# Patient Record
Sex: Female | Born: 1970 | Race: Black or African American | Hispanic: No | Smoking: Current every day smoker
Health system: Southern US, Community
[De-identification: ages and names within clinical notes are randomized; demographics above are authoritative.]

## PROBLEM LIST (undated history)

## (undated) DIAGNOSIS — F32A Depression, unspecified: Secondary | ICD-10-CM

## (undated) DIAGNOSIS — F329 Major depressive disorder, single episode, unspecified: Secondary | ICD-10-CM

## (undated) DIAGNOSIS — I10 Essential (primary) hypertension: Secondary | ICD-10-CM

## (undated) DIAGNOSIS — C801 Malignant (primary) neoplasm, unspecified: Secondary | ICD-10-CM

## (undated) DIAGNOSIS — Z8572 Personal history of non-Hodgkin lymphomas: Principal | ICD-10-CM

## (undated) HISTORY — PX: PORTACATH PLACEMENT: SHX2246

## (undated) HISTORY — PX: BONE BIOPSY: SHX375

## (undated) HISTORY — PX: ABDOMINAL HYSTERECTOMY: SHX81

## (undated) HISTORY — PX: PORT-A-CATH REMOVAL: SHX5289

## (undated) HISTORY — DX: Personal history of non-Hodgkin lymphomas: Z85.72

---

## 1997-09-03 ENCOUNTER — Ambulatory Visit (HOSPITAL_COMMUNITY): Admission: RE | Admit: 1997-09-03 | Discharge: 1997-09-03 | Payer: Self-pay | Admitting: Obstetrics & Gynecology

## 1997-10-07 ENCOUNTER — Other Ambulatory Visit: Admission: RE | Admit: 1997-10-07 | Discharge: 1997-10-07 | Payer: Self-pay | Admitting: *Deleted

## 1997-10-25 ENCOUNTER — Other Ambulatory Visit: Admission: RE | Admit: 1997-10-25 | Discharge: 1997-10-25 | Payer: Self-pay | Admitting: *Deleted

## 1998-01-13 ENCOUNTER — Inpatient Hospital Stay (HOSPITAL_COMMUNITY): Admission: AD | Admit: 1998-01-13 | Discharge: 1998-01-16 | Payer: Self-pay | Admitting: Obstetrics & Gynecology

## 1998-01-17 ENCOUNTER — Encounter (HOSPITAL_COMMUNITY): Admission: RE | Admit: 1998-01-17 | Discharge: 1998-01-28 | Payer: Self-pay | Admitting: Obstetrics & Gynecology

## 1998-01-20 ENCOUNTER — Inpatient Hospital Stay (HOSPITAL_COMMUNITY): Admission: AD | Admit: 1998-01-20 | Discharge: 1998-01-20 | Payer: Self-pay | Admitting: Obstetrics & Gynecology

## 1999-07-20 ENCOUNTER — Other Ambulatory Visit: Admission: RE | Admit: 1999-07-20 | Discharge: 1999-07-20 | Payer: Self-pay | Admitting: Family Medicine

## 2000-12-08 ENCOUNTER — Encounter: Payer: Self-pay | Admitting: Family Medicine

## 2000-12-08 ENCOUNTER — Encounter: Admission: RE | Admit: 2000-12-08 | Discharge: 2000-12-08 | Payer: Self-pay | Admitting: Family Medicine

## 2002-12-20 ENCOUNTER — Other Ambulatory Visit: Admission: RE | Admit: 2002-12-20 | Discharge: 2002-12-20 | Payer: Self-pay | Admitting: Family Medicine

## 2003-06-29 ENCOUNTER — Emergency Department (HOSPITAL_COMMUNITY): Admission: EM | Admit: 2003-06-29 | Discharge: 2003-06-29 | Payer: Self-pay

## 2006-12-15 ENCOUNTER — Ambulatory Visit (HOSPITAL_COMMUNITY): Admission: RE | Admit: 2006-12-15 | Discharge: 2006-12-15 | Payer: Self-pay | Admitting: Obstetrics & Gynecology

## 2007-06-19 ENCOUNTER — Inpatient Hospital Stay (HOSPITAL_COMMUNITY): Admission: AD | Admit: 2007-06-19 | Discharge: 2007-06-29 | Payer: Self-pay | Admitting: *Deleted

## 2007-06-19 ENCOUNTER — Ambulatory Visit: Payer: Self-pay | Admitting: *Deleted

## 2007-06-19 ENCOUNTER — Emergency Department (HOSPITAL_COMMUNITY): Admission: EM | Admit: 2007-06-19 | Discharge: 2007-06-19 | Payer: Self-pay | Admitting: Emergency Medicine

## 2008-01-08 ENCOUNTER — Emergency Department (HOSPITAL_COMMUNITY): Admission: EM | Admit: 2008-01-08 | Discharge: 2008-01-09 | Payer: Self-pay | Admitting: Emergency Medicine

## 2008-06-06 ENCOUNTER — Inpatient Hospital Stay (HOSPITAL_COMMUNITY): Admission: EM | Admit: 2008-06-06 | Discharge: 2008-06-18 | Payer: Self-pay | Admitting: Emergency Medicine

## 2008-06-07 ENCOUNTER — Encounter (INDEPENDENT_AMBULATORY_CARE_PROVIDER_SITE_OTHER): Payer: Self-pay | Admitting: Internal Medicine

## 2008-06-07 ENCOUNTER — Ambulatory Visit: Payer: Self-pay | Admitting: Hematology & Oncology

## 2008-06-10 ENCOUNTER — Encounter (INDEPENDENT_AMBULATORY_CARE_PROVIDER_SITE_OTHER): Payer: Self-pay | Admitting: Internal Medicine

## 2008-06-11 ENCOUNTER — Encounter (INDEPENDENT_AMBULATORY_CARE_PROVIDER_SITE_OTHER): Payer: Self-pay | Admitting: Interventional Radiology

## 2008-06-12 ENCOUNTER — Encounter (INDEPENDENT_AMBULATORY_CARE_PROVIDER_SITE_OTHER): Payer: Self-pay | Admitting: General Surgery

## 2008-06-14 ENCOUNTER — Encounter: Payer: Self-pay | Admitting: Hematology & Oncology

## 2008-06-15 ENCOUNTER — Ambulatory Visit: Payer: Self-pay | Admitting: Oncology

## 2008-06-18 ENCOUNTER — Ambulatory Visit: Payer: Self-pay | Admitting: Hematology & Oncology

## 2008-06-25 LAB — CHCC SATELLITE - SMEAR

## 2008-06-25 LAB — COMPREHENSIVE METABOLIC PANEL
ALT: 9 U/L (ref 0–35)
AST: 9 U/L (ref 0–37)
Chloride: 107 mEq/L (ref 96–112)
Creatinine, Ser: 0.65 mg/dL (ref 0.40–1.20)
Total Bilirubin: 0.5 mg/dL (ref 0.3–1.2)

## 2008-06-25 LAB — CBC WITH DIFFERENTIAL (CANCER CENTER ONLY)
BASO%: 0.3 % (ref 0.0–2.0)
Eosinophils Absolute: 0 10*3/uL (ref 0.0–0.5)
LYMPH#: 1.3 10*3/uL (ref 0.9–3.3)
MONO#: 0.1 10*3/uL (ref 0.1–0.9)
NEUT#: 0.3 10*3/uL — CL (ref 1.5–6.5)
Platelets: 81 10*3/uL — ABNORMAL LOW (ref 145–400)
RBC: 3.56 10*6/uL — ABNORMAL LOW (ref 3.70–5.32)
RDW: 15.4 % — ABNORMAL HIGH (ref 10.5–14.6)
WBC: 1.7 10*3/uL — ABNORMAL LOW (ref 3.9–10.0)

## 2008-07-10 LAB — COMPREHENSIVE METABOLIC PANEL
ALT: <8 U/L (ref 0–35)
AST: 11 U/L (ref 0–37)
BUN: 10 mg/dL (ref 6–23)
CO2: 21 mEq/L (ref 19–32)
Calcium: 8.9 mg/dL (ref 8.4–10.5)
Creatinine, Ser: 0.73 mg/dL (ref 0.40–1.20)
Total Bilirubin: 0.5 mg/dL (ref 0.3–1.2)

## 2008-07-10 LAB — CHCC SATELLITE - SMEAR

## 2008-07-10 LAB — CBC WITH DIFFERENTIAL (CANCER CENTER ONLY)
BASO#: 0 10*3/uL (ref 0.0–0.2)
Eosinophils Absolute: 0.1 10*3/uL (ref 0.0–0.5)
HCT: 26.5 % — ABNORMAL LOW (ref 34.8–46.6)
HGB: 8.9 g/dL — ABNORMAL LOW (ref 11.6–15.9)
LYMPH#: 1.3 10*3/uL (ref 0.9–3.3)
MCHC: 33.5 g/dL (ref 32.0–36.0)
MONO#: 0.3 10*3/uL (ref 0.1–0.9)
NEUT#: 1.4 10*3/uL — ABNORMAL LOW (ref 1.5–6.5)
NEUT%: 46.2 % (ref 39.6–80.0)
RBC: 3.08 10*6/uL — ABNORMAL LOW (ref 3.70–5.32)

## 2008-07-10 LAB — TECHNOLOGIST REVIEW CHCC SATELLITE

## 2008-07-24 ENCOUNTER — Ambulatory Visit (HOSPITAL_COMMUNITY): Admission: RE | Admit: 2008-07-24 | Discharge: 2008-07-24 | Payer: Self-pay | Admitting: Hematology & Oncology

## 2008-07-31 LAB — CBC WITH DIFFERENTIAL (CANCER CENTER ONLY)
BASO%: 0.6 % (ref 0.0–2.0)
EOS%: 0.1 % (ref 0.0–7.0)
Eosinophils Absolute: 0 10*3/uL (ref 0.0–0.5)
LYMPH%: 23.7 % (ref 14.0–48.0)
MCH: 32.3 pg (ref 26.0–34.0)
MONO%: 4.1 % (ref 0.0–13.0)
NEUT#: 3.5 10*3/uL (ref 1.5–6.5)
Platelets: 208 10*3/uL (ref 145–400)
RBC: 3.06 10*6/uL — ABNORMAL LOW (ref 3.70–5.32)
RDW: 26.8 % — ABNORMAL HIGH (ref 10.5–14.6)

## 2008-07-31 LAB — COMPREHENSIVE METABOLIC PANEL
Albumin: 4 g/dL (ref 3.5–5.2)
BUN: 10 mg/dL (ref 6–23)
CO2: 23 mEq/L (ref 19–32)
Glucose, Bld: 74 mg/dL (ref 70–99)
Sodium: 140 mEq/L (ref 135–145)
Total Bilirubin: 0.4 mg/dL (ref 0.3–1.2)
Total Protein: 5.7 g/dL — ABNORMAL LOW (ref 6.0–8.3)

## 2008-07-31 LAB — LACTATE DEHYDROGENASE: LDH: 166 U/L (ref 94–250)

## 2008-08-20 ENCOUNTER — Ambulatory Visit: Payer: Self-pay | Admitting: Hematology & Oncology

## 2008-08-21 LAB — CBC WITH DIFFERENTIAL (CANCER CENTER ONLY)
BASO#: 0 10*3/uL (ref 0.0–0.2)
EOS%: 1.2 % (ref 0.0–7.0)
HCT: 31.9 % — ABNORMAL LOW (ref 34.8–46.6)
HGB: 10.7 g/dL — ABNORMAL LOW (ref 11.6–15.9)
MCH: 32.8 pg (ref 26.0–34.0)
MCHC: 33.6 g/dL (ref 32.0–36.0)
MONO%: 5.4 % (ref 0.0–13.0)
NEUT%: 67.3 % (ref 39.6–80.0)

## 2008-08-21 LAB — COMPREHENSIVE METABOLIC PANEL
Albumin: 3.8 g/dL (ref 3.5–5.2)
Alkaline Phosphatase: 38 U/L — ABNORMAL LOW (ref 39–117)
CO2: 23 mEq/L (ref 19–32)
Calcium: 8.5 mg/dL (ref 8.4–10.5)
Chloride: 110 mEq/L (ref 96–112)
Glucose, Bld: 86 mg/dL (ref 70–99)
Potassium: 4 mEq/L (ref 3.5–5.3)
Sodium: 141 mEq/L (ref 135–145)
Total Protein: 5.4 g/dL — ABNORMAL LOW (ref 6.0–8.3)

## 2008-09-04 ENCOUNTER — Ambulatory Visit (HOSPITAL_COMMUNITY): Admission: RE | Admit: 2008-09-04 | Discharge: 2008-09-04 | Payer: Self-pay | Admitting: Hematology & Oncology

## 2008-09-11 LAB — CBC WITH DIFFERENTIAL (CANCER CENTER ONLY)
BASO#: 0 10*3/uL (ref 0.0–0.2)
BASO%: 0.5 % (ref 0.0–2.0)
EOS%: 0.8 % (ref 0.0–7.0)
Eosinophils Absolute: 0 10*3/uL (ref 0.0–0.5)
HCT: 35.8 % (ref 34.8–46.6)
HGB: 12.3 g/dL (ref 11.6–15.9)
LYMPH#: 1.3 10*3/uL (ref 0.9–3.3)
LYMPH%: 28 % (ref 14.0–48.0)
MCH: 34.3 pg — ABNORMAL HIGH (ref 26.0–34.0)
MCHC: 34.3 g/dL (ref 32.0–36.0)
MCV: 100 fL (ref 81–101)
MONO#: 0.3 10*3/uL (ref 0.1–0.9)
MONO%: 6.4 % (ref 0.0–13.0)
NEUT#: 2.9 10*3/uL (ref 1.5–6.5)
NEUT%: 64.3 % (ref 39.6–80.0)
Platelets: 334 10*3/uL (ref 145–400)
RBC: 3.58 10*6/uL — ABNORMAL LOW (ref 3.70–5.32)
RDW: 13.5 % (ref 10.5–14.6)
WBC: 4.6 10*3/uL (ref 3.9–10.0)

## 2008-09-11 LAB — COMPREHENSIVE METABOLIC PANEL
AST: 18 U/L (ref 0–37)
Alkaline Phosphatase: 43 U/L (ref 39–117)
Glucose, Bld: 82 mg/dL (ref 70–99)
Sodium: 142 mEq/L (ref 135–145)
Total Bilirubin: 0.5 mg/dL (ref 0.3–1.2)
Total Protein: 6.2 g/dL (ref 6.0–8.3)

## 2008-10-02 LAB — COMPREHENSIVE METABOLIC PANEL
Alkaline Phosphatase: 39 U/L (ref 39–117)
BUN: 8 mg/dL (ref 6–23)
Glucose, Bld: 81 mg/dL (ref 70–99)
Total Bilirubin: 0.4 mg/dL (ref 0.3–1.2)

## 2008-10-02 LAB — CBC WITH DIFFERENTIAL (CANCER CENTER ONLY)
BASO#: 0.1 10*3/uL (ref 0.0–0.2)
Eosinophils Absolute: 0.1 10*3/uL (ref 0.0–0.5)
HCT: 35.4 % (ref 34.8–46.6)
HGB: 11.8 g/dL (ref 11.6–15.9)
LYMPH#: 0.8 10*3/uL — ABNORMAL LOW (ref 0.9–3.3)
MONO#: 0.2 10*3/uL (ref 0.1–0.9)
NEUT%: 64.3 % (ref 39.6–80.0)
WBC: 3.3 10*3/uL — ABNORMAL LOW (ref 3.9–10.0)

## 2008-10-23 ENCOUNTER — Ambulatory Visit (HOSPITAL_COMMUNITY): Admission: RE | Admit: 2008-10-23 | Discharge: 2008-10-23 | Payer: Self-pay | Admitting: Hematology & Oncology

## 2008-11-04 ENCOUNTER — Ambulatory Visit: Payer: Self-pay | Admitting: Hematology & Oncology

## 2008-11-05 ENCOUNTER — Ambulatory Visit (HOSPITAL_COMMUNITY): Admission: RE | Admit: 2008-11-05 | Discharge: 2008-11-05 | Payer: Self-pay | Admitting: Hematology & Oncology

## 2008-11-05 ENCOUNTER — Ambulatory Visit: Payer: Self-pay | Admitting: Hematology & Oncology

## 2008-11-05 ENCOUNTER — Encounter: Payer: Self-pay | Admitting: Hematology & Oncology

## 2008-11-19 LAB — COMPREHENSIVE METABOLIC PANEL
AST: 12 U/L (ref 0–37)
Alkaline Phosphatase: 48 U/L (ref 39–117)
BUN: 11 mg/dL (ref 6–23)
Creatinine, Ser: 0.87 mg/dL (ref 0.40–1.20)
Glucose, Bld: 99 mg/dL (ref 70–99)
Potassium: 4.3 mEq/L (ref 3.5–5.3)
Total Bilirubin: 0.5 mg/dL (ref 0.3–1.2)

## 2008-11-19 LAB — CBC WITH DIFFERENTIAL (CANCER CENTER ONLY)
Eosinophils Absolute: 0.1 10*3/uL (ref 0.0–0.5)
HCT: 40.8 % (ref 34.8–46.6)
HGB: 13.8 g/dL (ref 11.6–15.9)
MCV: 98 fL (ref 81–101)
MONO#: 0.2 10*3/uL (ref 0.1–0.9)
NEUT#: 3.2 10*3/uL (ref 1.5–6.5)
Platelets: 283 10*3/uL (ref 145–400)

## 2008-11-28 LAB — HCG, SERUM, QUALITATIVE: Preg, Serum: NEGATIVE

## 2008-12-11 ENCOUNTER — Ambulatory Visit: Payer: Self-pay | Admitting: Hematology & Oncology

## 2008-12-13 LAB — COMPREHENSIVE METABOLIC PANEL
AST: 12 U/L (ref 0–37)
Albumin: 4.6 g/dL (ref 3.5–5.2)
Alkaline Phosphatase: 53 U/L (ref 39–117)
Chloride: 108 mEq/L (ref 96–112)
Glucose, Bld: 81 mg/dL (ref 70–99)
Potassium: 4.1 mEq/L (ref 3.5–5.3)
Sodium: 141 mEq/L (ref 135–145)
Total Protein: 6.8 g/dL (ref 6.0–8.3)

## 2008-12-13 LAB — CBC WITH DIFFERENTIAL (CANCER CENTER ONLY)
Eosinophils Absolute: 0.1 10*3/uL (ref 0.0–0.5)
HCT: 41.5 % (ref 34.8–46.6)
LYMPH%: 30.1 % (ref 14.0–48.0)
MCV: 97 fL (ref 81–101)
MONO#: 0.2 10*3/uL (ref 0.1–0.9)
NEUT%: 62.6 % (ref 39.6–80.0)
Platelets: 247 10*3/uL (ref 145–400)
RBC: 4.26 10*6/uL (ref 3.70–5.32)
WBC: 4.8 10*3/uL (ref 3.9–10.0)

## 2009-02-27 ENCOUNTER — Ambulatory Visit: Payer: Self-pay | Admitting: Hematology & Oncology

## 2009-03-07 LAB — CBC WITH DIFFERENTIAL (CANCER CENTER ONLY)
BASO#: 0.1 10*3/uL (ref 0.0–0.2)
BASO%: 0.8 % (ref 0.0–2.0)
Eosinophils Absolute: 0.2 10*3/uL (ref 0.0–0.5)
HCT: 45.2 % (ref 34.8–46.6)
HGB: 15.6 g/dL (ref 11.6–15.9)
LYMPH#: 1.5 10*3/uL (ref 0.9–3.3)
LYMPH%: 25.7 % (ref 14.0–48.0)
MCV: 96 fL (ref 81–101)
MONO#: 0.3 10*3/uL (ref 0.1–0.9)
NEUT%: 65.6 % (ref 39.6–80.0)
RDW: 11.3 % (ref 10.5–14.6)
WBC: 6 10*3/uL (ref 3.9–10.0)

## 2009-03-07 LAB — COMPREHENSIVE METABOLIC PANEL
ALT: 10 U/L (ref 0–35)
AST: 14 U/L (ref 0–37)
Albumin: 4.8 g/dL (ref 3.5–5.2)
BUN: 8 mg/dL (ref 6–23)
Calcium: 9.8 mg/dL (ref 8.4–10.5)
Chloride: 104 mEq/L (ref 96–112)
Potassium: 4 mEq/L (ref 3.5–5.3)
Sodium: 139 mEq/L (ref 135–145)
Total Protein: 7.1 g/dL (ref 6.0–8.3)

## 2009-04-07 ENCOUNTER — Ambulatory Visit: Payer: Self-pay | Admitting: Hematology & Oncology

## 2009-05-08 ENCOUNTER — Ambulatory Visit: Payer: Self-pay | Admitting: Hematology & Oncology

## 2009-05-09 LAB — LACTATE DEHYDROGENASE: LDH: 183 U/L (ref 94–250)

## 2009-05-09 LAB — URINALYSIS, MICROSCOPIC (CHCC SATELLITE)
Bilirubin (Urine): NEGATIVE
Protein: NEGATIVE mg/dL
pH: 6 (ref 4.60–8.00)

## 2009-05-09 LAB — COMPREHENSIVE METABOLIC PANEL
ALT: 26 U/L (ref 0–35)
CO2: 24 mEq/L (ref 19–32)
Calcium: 8.6 mg/dL (ref 8.4–10.5)
Chloride: 102 mEq/L (ref 96–112)
Creatinine, Ser: 0.81 mg/dL (ref 0.40–1.20)
Glucose, Bld: 97 mg/dL (ref 70–99)
Total Bilirubin: 0.6 mg/dL (ref 0.3–1.2)

## 2009-05-09 LAB — CBC WITH DIFFERENTIAL (CANCER CENTER ONLY)
BASO#: 0 10*3/uL (ref 0.0–0.2)
BASO%: 1.3 % (ref 0.0–2.0)
Eosinophils Absolute: 0.2 10*3/uL (ref 0.0–0.5)
HCT: 42.2 % (ref 34.8–46.6)
HGB: 14.7 g/dL (ref 11.6–15.9)
LYMPH#: 1.2 10*3/uL (ref 0.9–3.3)
LYMPH%: 35.3 % (ref 14.0–48.0)
MCV: 96 fL (ref 81–101)
MONO#: 0.2 10*3/uL (ref 0.1–0.9)
NEUT%: 51.8 % (ref 39.6–80.0)
RBC: 4.38 10*6/uL (ref 3.70–5.32)
RDW: 10.7 % (ref 10.5–14.6)
WBC: 3.3 10*3/uL — ABNORMAL LOW (ref 3.9–10.0)

## 2009-05-09 LAB — VITAMIN D 25 HYDROXY (VIT D DEFICIENCY, FRACTURES): Vit D, 25-Hydroxy: 14 ng/mL — ABNORMAL LOW (ref 30–89)

## 2009-07-03 ENCOUNTER — Ambulatory Visit: Payer: Self-pay | Admitting: Hematology & Oncology

## 2009-07-04 LAB — CBC WITH DIFFERENTIAL (CANCER CENTER ONLY)
BASO#: 0 10*3/uL (ref 0.0–0.2)
Eosinophils Absolute: 0.2 10*3/uL (ref 0.0–0.5)
HCT: 43.2 % (ref 34.8–46.6)
HGB: 14.5 g/dL (ref 11.6–15.9)
LYMPH%: 38.3 % (ref 14.0–48.0)
MCH: 33 pg (ref 26.0–34.0)
MCV: 98 fL (ref 81–101)
MONO#: 0.3 10*3/uL (ref 0.1–0.9)
Platelets: 243 10*3/uL (ref 145–400)
RBC: 4.41 10*6/uL (ref 3.70–5.32)
WBC: 5.2 10*3/uL (ref 3.9–10.0)

## 2009-07-04 LAB — COMPREHENSIVE METABOLIC PANEL
Albumin: 4.4 g/dL (ref 3.5–5.2)
Alkaline Phosphatase: 63 U/L (ref 39–117)
BUN: 9 mg/dL (ref 6–23)
Calcium: 8.7 mg/dL (ref 8.4–10.5)
Chloride: 108 mEq/L (ref 96–112)
Glucose, Bld: 81 mg/dL (ref 70–99)
Potassium: 4 mEq/L (ref 3.5–5.3)
Sodium: 141 mEq/L (ref 135–145)
Total Protein: 6.2 g/dL (ref 6.0–8.3)

## 2009-07-17 IMAGING — PT NM PET TUM IMG RESTAG (PS) SKULL BASE T - THIGH
1 of 6 series · 1 of 25 positions shown · non-contrast
Comparison: CT scan chest abdomen and pelvis 06/06/2008. PET CT
06/14/2008.

CLINICAL DATA: Subsequent treatment strategy for non-Hodgkins
lymphoma.  

NUCLEAR MEDICINE PET CT RESTAGING (PS) SKULL BASE TO THIGH
TECHNIQUE: 17.6 mCi F-18 FDG was injected intravenously via the
left antecubital.  Full-ring PET imaging was performed from the
skull base through the mid-thighs 80  minutes after injection.  CT
data was obtained and used for attenuation correction and anatomic
localization only.  (This was not acquired as a diagnostic CT
examination.)
Fasting Blood Glucose:  77

[Series 2: ct images · axial · 3.8mm · 0.98mm/px · 1 of 223 slices shown]
[im 223/223  brain]
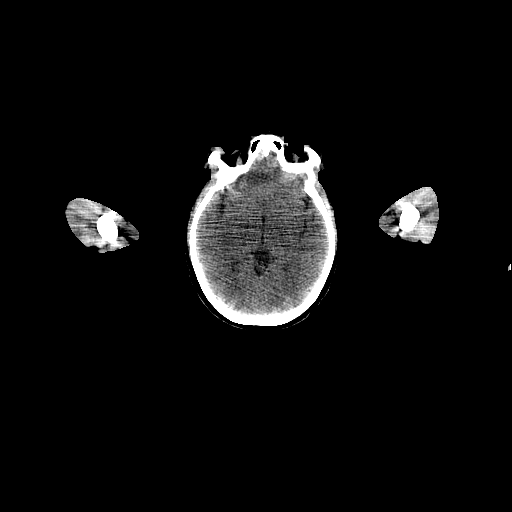

[1 of 25 positions shown; findings below may reference images not displayed]

FINDINGS: Significant improved appearance since the prior PET CT.
Interval decrease in adenopathy and the spleen is much smaller and
does not demonstrate any persistent abnormal FDG uptake.  There is
persistent low level FDG uptake in the areas of adenopathy in the
neck, chest, abdomen and pelvis.   SUV max ranges from 2.5-4.1. It
was previously in the 8-10 range on the prior study.

Gallstones are again noted.  The small bowel and colon are grossly
normal.  There is diffuse low level uptake in the marrow which may
be secondary to chemotherapy or marrow stimulating drugs.
IMPRESSION: 1.  Overall interval decrease in adenopathy involving the neck,
chest, abdomen and pelvis.
2.  Significant interval decrease in size of the spleen and no
persistent abnormal FDG uptake in the spleen
3.  Diffuse low level FDG uptake in the areas of adenopathy. This
has significantly decreased.

## 2009-08-28 ENCOUNTER — Ambulatory Visit: Payer: Self-pay | Admitting: Hematology & Oncology

## 2009-09-26 LAB — CBC WITH DIFFERENTIAL (CANCER CENTER ONLY)
BASO#: 0 10*3/uL (ref 0.0–0.2)
BASO%: 0.8 % (ref 0.0–2.0)
EOS%: 2.1 % (ref 0.0–7.0)
HCT: 43.2 % (ref 34.8–46.6)
HGB: 14.9 g/dL (ref 11.6–15.9)
LYMPH#: 2.6 10*3/uL (ref 0.9–3.3)
LYMPH%: 49.5 % — ABNORMAL HIGH (ref 14.0–48.0)
MCH: 34.3 pg — ABNORMAL HIGH (ref 26.0–34.0)
MCHC: 34.5 g/dL (ref 32.0–36.0)
MONO%: 4 % (ref 0.0–13.0)
NEUT%: 43.6 % (ref 39.6–80.0)
RDW: 10.8 % (ref 10.5–14.6)

## 2009-09-26 LAB — COMPREHENSIVE METABOLIC PANEL
AST: 14 U/L (ref 0–37)
Albumin: 4.4 g/dL (ref 3.5–5.2)
Alkaline Phosphatase: 52 U/L (ref 39–117)
BUN: 12 mg/dL (ref 6–23)
Creatinine, Ser: 0.87 mg/dL (ref 0.40–1.20)
Glucose, Bld: 85 mg/dL (ref 70–99)
Total Bilirubin: 0.8 mg/dL (ref 0.3–1.2)

## 2009-11-14 ENCOUNTER — Ambulatory Visit: Payer: Self-pay | Admitting: Hematology & Oncology

## 2009-11-17 LAB — COMPREHENSIVE METABOLIC PANEL
ALT: 11 U/L (ref 0–35)
Albumin: 4.4 g/dL (ref 3.5–5.2)
Alkaline Phosphatase: 46 U/L (ref 39–117)
CO2: 24 mEq/L (ref 19–32)
Glucose, Bld: 99 mg/dL (ref 70–99)
Potassium: 3.7 mEq/L (ref 3.5–5.3)
Sodium: 141 mEq/L (ref 135–145)
Total Bilirubin: 0.5 mg/dL (ref 0.3–1.2)
Total Protein: 6.4 g/dL (ref 6.0–8.3)

## 2009-11-17 LAB — CBC WITH DIFFERENTIAL (CANCER CENTER ONLY)
BASO%: 0.5 % (ref 0.0–2.0)
Eosinophils Absolute: 0.1 10*3/uL (ref 0.0–0.5)
HCT: 43.3 % (ref 34.8–46.6)
HGB: 14.7 g/dL (ref 11.6–15.9)
MCH: 34.1 pg — ABNORMAL HIGH (ref 26.0–34.0)
MCHC: 34 g/dL (ref 32.0–36.0)
MCV: 100 fL (ref 81–101)
MONO#: 0.2 10*3/uL (ref 0.1–0.9)
MONO%: 3.8 % (ref 0.0–13.0)

## 2009-11-17 LAB — FERRITIN: Ferritin: 26 ng/mL (ref 10–291)

## 2009-12-25 ENCOUNTER — Ambulatory Visit: Payer: Self-pay | Admitting: Hematology & Oncology

## 2009-12-26 LAB — CBC WITH DIFFERENTIAL (CANCER CENTER ONLY)
BASO#: 0 10*3/uL (ref 0.0–0.2)
EOS%: 3.7 % (ref 0.0–7.0)
HCT: 40 % (ref 34.8–46.6)
HGB: 13.5 g/dL (ref 11.6–15.9)
MCH: 32.9 pg (ref 26.0–34.0)
MCHC: 33.7 g/dL (ref 32.0–36.0)
MONO%: 6.3 % (ref 0.0–13.0)
NEUT%: 46.3 % (ref 39.6–80.0)
RDW: 11.2 % (ref 10.5–14.6)

## 2009-12-26 LAB — COMPREHENSIVE METABOLIC PANEL
AST: 11 U/L (ref 0–37)
Alkaline Phosphatase: 45 U/L (ref 39–117)
BUN: 7 mg/dL (ref 6–23)
Calcium: 9.7 mg/dL (ref 8.4–10.5)
Creatinine, Ser: 0.85 mg/dL (ref 0.40–1.20)
Total Bilirubin: 0.5 mg/dL (ref 0.3–1.2)

## 2010-03-26 ENCOUNTER — Ambulatory Visit: Payer: Self-pay | Admitting: Hematology & Oncology

## 2010-03-27 LAB — CBC WITH DIFFERENTIAL (CANCER CENTER ONLY)
BASO%: 0.7 % (ref 0.0–2.0)
LYMPH%: 39 % (ref 14.0–48.0)
MCH: 33.5 pg (ref 26.0–34.0)
MCV: 99 fL (ref 81–101)
MONO%: 5.6 % (ref 0.0–13.0)
NEUT#: 3.3 10*3/uL (ref 1.5–6.5)
Platelets: 307 10*3/uL (ref 145–400)
RDW: 10.7 % (ref 10.5–14.6)
WBC: 6.3 10*3/uL (ref 3.9–10.0)

## 2010-03-27 LAB — CHCC SATELLITE - SMEAR

## 2010-03-28 LAB — COMPREHENSIVE METABOLIC PANEL
ALT: 9 U/L (ref 0–35)
AST: 13 U/L (ref 0–37)
Alkaline Phosphatase: 53 U/L (ref 39–117)
Creatinine, Ser: 0.83 mg/dL (ref 0.40–1.20)
Sodium: 139 mEq/L (ref 135–145)
Total Bilirubin: 0.6 mg/dL (ref 0.3–1.2)
Total Protein: 6.5 g/dL (ref 6.0–8.3)

## 2010-05-10 ENCOUNTER — Encounter: Payer: Self-pay | Admitting: Hematology & Oncology

## 2010-06-26 ENCOUNTER — Other Ambulatory Visit: Payer: Self-pay | Admitting: Hematology & Oncology

## 2010-06-26 ENCOUNTER — Encounter (HOSPITAL_BASED_OUTPATIENT_CLINIC_OR_DEPARTMENT_OTHER): Payer: Medicaid Other | Admitting: Hematology & Oncology

## 2010-06-26 DIAGNOSIS — C8298 Follicular lymphoma, unspecified, lymph nodes of multiple sites: Secondary | ICD-10-CM

## 2010-06-26 DIAGNOSIS — Z5112 Encounter for antineoplastic immunotherapy: Secondary | ICD-10-CM

## 2010-06-26 LAB — CBC WITH DIFFERENTIAL (CANCER CENTER ONLY)
BASO%: 0.3 % (ref 0.0–2.0)
LYMPH%: 37 % (ref 14.0–48.0)
MCV: 96 fL (ref 81–101)
MONO#: 0.5 10*3/uL (ref 0.1–0.9)
NEUT#: 3.7 10*3/uL (ref 1.5–6.5)
Platelets: 229 10*3/uL (ref 145–400)
RDW: 12.8 % (ref 11.1–15.7)
WBC: 7 10*3/uL (ref 3.9–10.0)

## 2010-06-26 LAB — COMPREHENSIVE METABOLIC PANEL
AST: 13 U/L (ref 0–37)
Albumin: 5 g/dL (ref 3.5–5.2)
BUN: 14 mg/dL (ref 6–23)
CO2: 27 mEq/L (ref 19–32)
Calcium: 9.7 mg/dL (ref 8.4–10.5)
Chloride: 104 mEq/L (ref 96–112)
Glucose, Bld: 79 mg/dL (ref 70–99)
Potassium: 3.7 mEq/L (ref 3.5–5.3)

## 2010-06-26 LAB — LACTATE DEHYDROGENASE: LDH: 161 U/L (ref 94–250)

## 2010-06-26 LAB — CHCC SATELLITE - SMEAR

## 2010-07-26 LAB — DIFFERENTIAL
Lymphocytes Relative: 39 % (ref 12–46)
Lymphs Abs: 0.9 10*3/uL (ref 0.7–4.0)
Monocytes Absolute: 0.3 10*3/uL (ref 0.1–1.0)
Monocytes Relative: 13 % — ABNORMAL HIGH (ref 3–12)
Neutro Abs: 1 10*3/uL — ABNORMAL LOW (ref 1.7–7.7)
Neutrophils Relative %: 44 % (ref 43–77)

## 2010-07-26 LAB — CBC
Hemoglobin: 12.1 g/dL (ref 12.0–15.0)
RBC: 3.51 MIL/uL — ABNORMAL LOW (ref 3.87–5.11)
WBC: 2.3 10*3/uL — ABNORMAL LOW (ref 4.0–10.5)

## 2010-07-26 LAB — CHROMOSOME ANALYSIS, BONE MARROW

## 2010-07-26 LAB — GLUCOSE, CAPILLARY: Glucose-Capillary: 101 mg/dL — ABNORMAL HIGH (ref 70–99)

## 2010-07-28 LAB — GLUCOSE, CAPILLARY: Glucose-Capillary: 90 mg/dL (ref 70–99)

## 2010-07-29 LAB — GLUCOSE, CAPILLARY: Glucose-Capillary: 77 mg/dL (ref 70–99)

## 2010-07-30 LAB — CBC
MCV: 84.8 fL (ref 78.0–100.0)
Platelets: 36 10*3/uL — CL (ref 150–400)
RBC: 3.01 MIL/uL — ABNORMAL LOW (ref 3.87–5.11)
WBC: 2.6 10*3/uL — ABNORMAL LOW (ref 4.0–10.5)
WBC: 2.7 10*3/uL — ABNORMAL LOW (ref 4.0–10.5)

## 2010-07-30 LAB — COMPREHENSIVE METABOLIC PANEL
ALT: 12 U/L (ref 0–35)
AST: 14 U/L (ref 0–37)
Albumin: 2.8 g/dL — ABNORMAL LOW (ref 3.5–5.2)
Chloride: 112 mEq/L (ref 96–112)
Creatinine, Ser: 0.52 mg/dL (ref 0.4–1.2)
GFR calc Af Amer: >60 mL/min (ref >60)
Potassium: 4.2 mEq/L (ref 3.5–5.1)
Sodium: 137 mEq/L (ref 135–145)
Total Bilirubin: 0.6 mg/dL (ref 0.3–1.2)

## 2010-07-30 LAB — BASIC METABOLIC PANEL
Calcium: 8.2 mg/dL — ABNORMAL LOW (ref 8.4–10.5)
Creatinine, Ser: 0.44 mg/dL (ref 0.4–1.2)
GFR calc Af Amer: >60 mL/min (ref >60)

## 2010-07-30 LAB — DIFFERENTIAL
Basophils Absolute: 0 10*3/uL (ref 0.0–0.1)
Eosinophils Relative: 0 % (ref 0–5)
Lymphs Abs: 2 10*3/uL (ref 0.7–4.0)
Monocytes Relative: 3 % (ref 3–12)
Neutro Abs: 0.6 10*3/uL — ABNORMAL LOW (ref 1.7–7.7)

## 2010-07-30 LAB — PHOSPHORUS: Phosphorus: 2.7 mg/dL (ref 2.3–4.6)

## 2010-07-30 LAB — LACTATE DEHYDROGENASE
LDH: 160 U/L (ref 94–250)
LDH: 202 U/L (ref 94–250)

## 2010-07-30 LAB — URIC ACID
Uric Acid, Serum: 3.6 mg/dL (ref 2.4–7.0)
Uric Acid, Serum: 3.8 mg/dL (ref 2.4–7.0)

## 2010-08-04 LAB — BASIC METABOLIC PANEL
BUN: 3 mg/dL — ABNORMAL LOW (ref 6–23)
BUN: 6 mg/dL (ref 6–23)
BUN: 9 mg/dL (ref 6–23)
BUN: 6 mg/dL (ref 6–23)
CO2: 22 mEq/L (ref 19–32)
CO2: 24 mEq/L (ref 19–32)
Chloride: 100 mEq/L (ref 96–112)
Chloride: 102 mEq/L (ref 96–112)
Chloride: 104 mEq/L (ref 96–112)
Chloride: 107 mEq/L (ref 96–112)
Creatinine, Ser: 0.64 mg/dL (ref 0.4–1.2)
Creatinine, Ser: 0.66 mg/dL (ref 0.4–1.2)
GFR calc Af Amer: >60 mL/min (ref >60)
GFR calc Af Amer: >60 mL/min (ref >60)
Glucose, Bld: 97 mg/dL (ref 70–99)
Potassium: 3.4 mEq/L — ABNORMAL LOW (ref 3.5–5.1)
Potassium: 3.7 mEq/L (ref 3.5–5.1)
Potassium: 4.3 mEq/L (ref 3.5–5.1)

## 2010-08-04 LAB — CROSSMATCH
ABO/RH(D): O NEG
ABO/RH(D): O NEG
Antibody Screen: NEGATIVE
Antibody Screen: NEGATIVE
Antibody Screen: NEGATIVE

## 2010-08-04 LAB — DIFFERENTIAL
Basophils Absolute: 0 10*3/uL (ref 0.0–0.1)
Basophils Absolute: 0 10*3/uL (ref 0.0–0.1)
Basophils Absolute: 0 10*3/uL (ref 0.0–0.1)
Basophils Relative: 0 % (ref 0–1)
Blasts: 0 %
Eosinophils Absolute: 0 10*3/uL (ref 0.0–0.7)
Eosinophils Absolute: 0 10*3/uL (ref 0.0–0.7)
Eosinophils Relative: 0 % (ref 0–5)
Eosinophils Relative: 0 % (ref 0–5)
Lymphocytes Relative: 89 % — ABNORMAL HIGH (ref 12–46)
Lymphs Abs: 20 10*3/uL — ABNORMAL HIGH (ref 0.7–4.0)
Lymphs Abs: 26.2 10*3/uL — ABNORMAL HIGH (ref 0.7–4.0)
Metamyelocytes Relative: 0 %
Monocytes Absolute: 0.3 10*3/uL (ref 0.1–1.0)
Monocytes Absolute: 0.5 10*3/uL (ref 0.1–1.0)
Monocytes Relative: 0 % — ABNORMAL LOW (ref 3–12)
Monocytes Relative: 1 % — ABNORMAL LOW (ref 3–12)
Monocytes Relative: 2 % — ABNORMAL LOW (ref 3–12)
Myelocytes: 0 %
Neutro Abs: 2.8 10*3/uL (ref 1.7–7.7)
Neutro Abs: 3.1 10*3/uL (ref 1.7–7.7)
Neutro Abs: 4.4 10*3/uL (ref 1.7–7.7)
Neutrophils Relative %: 12 % — ABNORMAL LOW (ref 43–77)
Neutrophils Relative %: 8 % — ABNORMAL LOW (ref 43–77)
Neutrophils Relative %: 50 % (ref 43–77)
Smear Review: DECREASED
nRBC: 0 /100 WBC

## 2010-08-04 LAB — DIC (DISSEMINATED INTRAVASCULAR COAGULATION)PANEL
Fibrinogen: 390 mg/dL (ref 204–475)
Platelets: 26 10*3/uL — CL (ref 150–400)
Prothrombin Time: 15.8 seconds — ABNORMAL HIGH (ref 11.6–15.2)
Smear Review: NONE SEEN

## 2010-08-04 LAB — COMPREHENSIVE METABOLIC PANEL
ALT: 9 U/L (ref 0–35)
ALT: 13 U/L (ref 0–35)
AST: 30 U/L (ref 0–37)
Albumin: 2.7 g/dL — ABNORMAL LOW (ref 3.5–5.2)
Alkaline Phosphatase: 76 U/L (ref 39–117)
Alkaline Phosphatase: 92 U/L (ref 39–117)
Alkaline Phosphatase: 95 U/L (ref 39–117)
BUN: 4 mg/dL — ABNORMAL LOW (ref 6–23)
BUN: 5 mg/dL — ABNORMAL LOW (ref 6–23)
BUN: 9 mg/dL (ref 6–23)
CO2: 23 mEq/L (ref 19–32)
CO2: 22 mEq/L (ref 19–32)
Calcium: 8.6 mg/dL (ref 8.4–10.5)
Calcium: 7.7 mg/dL — ABNORMAL LOW (ref 8.4–10.5)
Chloride: 110 mEq/L (ref 96–112)
Creatinine, Ser: 0.59 mg/dL (ref 0.4–1.2)
Creatinine, Ser: 0.59 mg/dL (ref 0.4–1.2)
GFR calc Af Amer: >60 mL/min (ref >60)
GFR calc Af Amer: >60 mL/min (ref >60)
GFR calc non Af Amer: >60 mL/min (ref >60)
GFR calc non Af Amer: >60 mL/min (ref >60)
Glucose, Bld: 75 mg/dL (ref 70–99)
Glucose, Bld: 82 mg/dL (ref 70–99)
Glucose, Bld: 120 mg/dL — ABNORMAL HIGH (ref 70–99)
Glucose, Bld: 88 mg/dL (ref 70–99)
Potassium: 3.8 mEq/L (ref 3.5–5.1)
Potassium: 4 mEq/L (ref 3.5–5.1)
Total Bilirubin: 0.7 mg/dL (ref 0.3–1.2)
Total Bilirubin: 0.5 mg/dL (ref 0.3–1.2)
Total Protein: 5.5 g/dL — ABNORMAL LOW (ref 6.0–8.3)
Total Protein: 6.2 g/dL (ref 6.0–8.3)
Total Protein: 4.7 g/dL — ABNORMAL LOW (ref 6.0–8.3)

## 2010-08-04 LAB — HEMOCCULT GUIAC POC 1CARD (OFFICE): Fecal Occult Bld: NEGATIVE

## 2010-08-04 LAB — CBC
HCT: 16.8 % — ABNORMAL LOW (ref 36.0–46.0)
HCT: 18 % — ABNORMAL LOW (ref 36.0–46.0)
HCT: 22.5 % — ABNORMAL LOW (ref 36.0–46.0)
HCT: 24.3 % — ABNORMAL LOW (ref 36.0–46.0)
HCT: 24.5 % — ABNORMAL LOW (ref 36.0–46.0)
HCT: 27.6 % — ABNORMAL LOW (ref 36.0–46.0)
HCT: 21.8 % — ABNORMAL LOW (ref 36.0–46.0)
HCT: 23.6 % — ABNORMAL LOW (ref 36.0–46.0)
Hemoglobin: 5.7 g/dL — CL (ref 12.0–15.0)
Hemoglobin: 8.4 g/dL — ABNORMAL LOW (ref 12.0–15.0)
Hemoglobin: 9.5 g/dL — ABNORMAL LOW (ref 12.0–15.0)
Hemoglobin: 7.4 g/dL — CL (ref 12.0–15.0)
Hemoglobin: 8.6 g/dL — ABNORMAL LOW (ref 12.0–15.0)
Hemoglobin: 9 g/dL — ABNORMAL LOW (ref 12.0–15.0)
MCHC: 34 g/dL (ref 30.0–36.0)
MCHC: 34.4 g/dL (ref 30.0–36.0)
MCHC: 33.5 g/dL (ref 30.0–36.0)
MCHC: 33.6 g/dL (ref 30.0–36.0)
MCHC: 34.1 g/dL (ref 30.0–36.0)
MCV: 88.8 fL (ref 78.0–100.0)
MCV: 89.6 fL (ref 78.0–100.0)
MCV: 90.3 fL (ref 78.0–100.0)
MCV: 91.2 fL (ref 78.0–100.0)
MCV: 91.8 fL (ref 78.0–100.0)
MCV: 91.9 fL (ref 78.0–100.0)
MCV: 94.2 fL (ref 78.0–100.0)
MCV: 86.2 fL (ref 78.0–100.0)
MCV: 89.5 fL (ref 78.0–100.0)
Platelets: 60 10*3/uL — ABNORMAL LOW (ref 150–400)
Platelets: 74 10*3/uL — ABNORMAL LOW (ref 150–400)
Platelets: 75 10*3/uL — ABNORMAL LOW (ref 150–400)
Platelets: 75 10*3/uL — ABNORMAL LOW (ref 150–400)
Platelets: 16 10*3/uL — CL (ref 150–400)
Platelets: 77 10*3/uL — ABNORMAL LOW (ref 150–400)
RBC: 1.96 MIL/uL — ABNORMAL LOW (ref 3.87–5.11)
RBC: 2.51 MIL/uL — ABNORMAL LOW (ref 3.87–5.11)
RBC: 2.73 MIL/uL — ABNORMAL LOW (ref 3.87–5.11)
RBC: 3.08 MIL/uL — ABNORMAL LOW (ref 3.87–5.11)
RBC: 2.64 MIL/uL — ABNORMAL LOW (ref 3.87–5.11)
RBC: 2.98 MIL/uL — ABNORMAL LOW (ref 3.87–5.11)
RBC: 3.05 MIL/uL — ABNORMAL LOW (ref 3.87–5.11)
RDW: 19.5 % — ABNORMAL HIGH (ref 11.5–15.5)
RDW: 20 % — ABNORMAL HIGH (ref 11.5–15.5)
RDW: 20.9 % — ABNORMAL HIGH (ref 11.5–15.5)
RDW: 22.1 % — ABNORMAL HIGH (ref 11.5–15.5)
RDW: 19.7 % — ABNORMAL HIGH (ref 11.5–15.5)
WBC: 27.9 10*3/uL — ABNORMAL HIGH (ref 4.0–10.5)
WBC: 28.4 10*3/uL — ABNORMAL HIGH (ref 4.0–10.5)
WBC: 30.7 10*3/uL — ABNORMAL HIGH (ref 4.0–10.5)
WBC: 36.3 10*3/uL — ABNORMAL HIGH (ref 4.0–10.5)
WBC: 1.9 10*3/uL — ABNORMAL LOW (ref 4.0–10.5)
WBC: 16.5 10*3/uL — ABNORMAL HIGH (ref 4.0–10.5)
WBC: 2.3 10*3/uL — ABNORMAL LOW (ref 4.0–10.5)

## 2010-08-04 LAB — RETICULOCYTES
RBC.: 2.04 MIL/uL — ABNORMAL LOW (ref 3.87–5.11)
Retic Count, Absolute: 44.9 10*3/uL (ref 19.0–186.0)
Retic Count, Absolute: 18.2 10*3/uL — ABNORMAL LOW (ref 19.0–186.0)
Retic Ct Pct: 2.2 % (ref 0.4–3.1)

## 2010-08-04 LAB — GLUCOSE, CAPILLARY
Glucose-Capillary: 77 mg/dL (ref 70–99)
Glucose-Capillary: 93 mg/dL (ref 70–99)
Glucose-Capillary: 95 mg/dL (ref 70–99)
Glucose-Capillary: 84 mg/dL (ref 70–99)

## 2010-08-04 LAB — BLOOD GAS, ARTERIAL
Acid-Base Excess: 0.5 mmol/L (ref 0.0–2.0)
Bicarbonate: 24.2 mEq/L — ABNORMAL HIGH (ref 20.0–24.0)
O2 Saturation: 98.4 %
Patient temperature: 98.6
TCO2: 25.3 mmol/L (ref 0–100)
pH, Arterial: 7.439 — ABNORMAL HIGH (ref 7.350–7.400)

## 2010-08-04 LAB — CULTURE, BLOOD (ROUTINE X 2): Culture: NO GROWTH

## 2010-08-04 LAB — IGG, IGA, IGM
IgA: 82 mg/dL (ref 68–378)
IgG (Immunoglobin G), Serum: 959 mg/dL (ref 694–1618)
IgM, Serum: 56 mg/dL — ABNORMAL LOW (ref 60–263)

## 2010-08-04 LAB — PHOSPHORUS: Phosphorus: 3.6 mg/dL (ref 2.3–4.6)

## 2010-08-04 LAB — PROTEIN ELECTROPH W RFLX QUANT IMMUNOGLOBULINS
Albumin ELP: 57.8 % (ref 55.8–66.1)
Alpha-1-Globulin: 7.4 % — ABNORMAL HIGH (ref 2.9–4.9)
Alpha-1-Globulin: 7.8 % — ABNORMAL HIGH (ref 2.9–4.9)
Beta 2: 3.7 % (ref 3.2–6.5)
Gamma Globulin: 12.3 % (ref 11.1–18.8)
M-Spike, %: NOT DETECTED g/dL
Total Protein ELP: 5.7 g/dL — ABNORMAL LOW (ref 6.0–8.3)

## 2010-08-04 LAB — URINALYSIS, ROUTINE W REFLEX MICROSCOPIC
Nitrite: NEGATIVE
Specific Gravity, Urine: 1.011 (ref 1.005–1.030)
Urobilinogen, UA: 1 mg/dL (ref 0.0–1.0)

## 2010-08-04 LAB — PATHOLOGIST SMEAR REVIEW

## 2010-08-04 LAB — PREGNANCY, URINE: Preg Test, Ur: NEGATIVE

## 2010-08-04 LAB — HAPTOGLOBIN: Haptoglobin: 215 mg/dL — ABNORMAL HIGH (ref 16–200)

## 2010-08-04 LAB — ABO/RH
ABO/RH(D): O NEG
ABO/RH(D): O NEG

## 2010-08-04 LAB — D-DIMER, QUANTITATIVE: D-Dimer, Quant: 8.31 ug/mL-FEU — ABNORMAL HIGH (ref 0.00–0.48)

## 2010-08-04 LAB — LACTATE DEHYDROGENASE
LDH: 193 U/L (ref 94–250)
LDH: 304 U/L — ABNORMAL HIGH (ref 94–250)
LDH: 318 U/L — ABNORMAL HIGH (ref 94–250)

## 2010-08-04 LAB — RPR: RPR Ser Ql: NONREACTIVE

## 2010-08-04 LAB — IRON AND TIBC
Iron: 91 ug/dL (ref 42–135)
UIBC: 182 ug/dL

## 2010-08-04 LAB — SEDIMENTATION RATE: Sed Rate: 16 mm/hr (ref 0–22)

## 2010-08-04 LAB — BONE MARROW EXAM

## 2010-08-04 LAB — TSH: TSH: 0.531 u[IU]/mL (ref 0.350–4.500)

## 2010-08-04 LAB — VITAMIN B12: Vitamin B-12: 813 pg/mL (ref 211–911)

## 2010-08-04 LAB — OCCULT BLOOD (STOOL CUP TO LAB): Fecal Occult Bld: NEGATIVE

## 2010-09-01 NOTE — Discharge Summary (Signed)
Angel Dunlap, Angel Dunlap              ACCOUNT NO.:  [id number]   MEDICAL RECORD NO.:  [id number]          PATIENT TYPE:  INP   LOCATION:  5156                         FACILITY:  [facility]   PHYSICIAN:  Rose Phi. Myna Hidalgo, M.D. DATE OF BIRTH:  [birth year]   DATE OF ADMISSION:  06/06/2008  DATE OF DISCHARGE:  06/18/2008                               DISCHARGE SUMMARY   DISCHARGE DIAGNOSES:  1. 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  2. 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  3. Bone marrow biopsy.  4. Insertion of Port-A-Cath.  5. Left axillary lymph node biopsy.  6. Right lower lobe pneumonia.  7. Initiation of chemotherapy with R-CHOP.  8. Depression.   CONDITION ON DISCHARGE:  Stable.   ACTIVITIES:  As tolerated.   DIET:  No restrictions.   FOLLOWUP:  1. The patient will come to the [facility] on      June 19, 2008, for a Neulasta shot.  2. Angel Dunlap will see Dr. Myna Hidalgo at the [facility] on June 25, 2008.   MEDICATIONS UPON DISCHARGE:  1. Cymbalta 60 mg p.o. daily  2. Trazodone 300 mg p.o. daily.  3. Ciprofloxacin 5 mg p.o. daily x10 days.  4. Phenergan 25 mg p.o. q.6 h., p.r.n. nausea.  5. Folic acid 1 mg p.o. daily.  6. Darvocet N 100 one-to-two -02 p.o. q.8 h., p.r.n. pain.  7. Ativan 0.5 mg p.o. q.8 h., p.r.n.  8. Allopurinol 100 mg p.o. daily x10 days.   HOSPITAL COURSE:  Angel Dunlap was initially admitted by Dr. Joelene Millin.  She  is a 40 year old female who has history of depression.  She also has a  history of tobacco use.   She was admitted because of fatigue, cough and shortness of breath.  She  noted swelling of lymph glands.   When she was admitted, she was found have right lower lobe pneumonia.  She was also found to have a white cell count of 36,000.  Hemoglobin was  5 and platelet count was 75,000.   Her workup ensued.  She was tested for HIV and this was negative.  She  was  started on IV antibiotics.  She also had a ferritin of 48.  She was  transfused multiple times during the hospital course.   She had a CT angiogram done which did not show any evidence of pulmonary  embolism.  However, she was found to have extensive lymphadenopathy.  She had massive splenomegaly measuring 22 x 11 x 18 cm.  She had bulky  retroperitoneal and peripheral lymph nodes.   We were subsequently asked to see her.  It was certainly apparent that  she had some type of lymphoproliferative process going on.   We looked at a blood smear.  Blood smear certainly looked consistent  with a lymphoma.   For her pneumonia, she was put on IV antibiotics with IV Rocephin and  Zithromax.  She was able to get off these after about 10 days.   We went  ahead and did an MRI of the brain.  This was done on June 07, 2008.  This was done and showed no evidence of lymphomatous  involvement.  She had a CT of the neck also on June 07, 2008.  CT of  the neck showed scattered lymphadenopathy throughout the neck.  Again,  there was bilateral air space disease which was felt to be pneumonia.   Her pneumonia cleared up quite nicely.   She had a MUGA scan done on June 13, 2008.  She had an ejection  fraction of 74%.   She had a bed PET done on June 14, 2008.  The PET scan showed  extensive metabolic uptake within the neck, chest, abdomen and pelvis.  Also noted was activity in the marrow spaces.   On the June 11, 2008, she underwent a bone marrow biopsy.  This was  done by interventional radiology.  The bone marrow report ([id number])  showed low grade B-cell non-Hodgkin's lymphoma.  Flow cytometry showed  that these cells were positive for CD-10, CD-20.  This is all consistent  with a follicular lymphoma.   We got surgery involved.  Dr. Derrell Lolling went ahead and did a left axillary  lymph node biopsy.  This was on June 12, 2008.  The pathology report  [facility] - [id number]) showed  follicular low grade B-cell lymphoma.  This again  stood out for CD-10 and CD-20.  There was kappa light chain restriction.   We had all of our tests done.  She had a Port-A-Cath placed at the same  time as the lymph node biopsy.   We then moved her [facility] to [facility].  We initiated  chemotherapy.   She started chemotherapy on June 14, 2008.  Her body surface area  was 1.66 m2.  Her chemotherapy was as follows:  1. Rituxan (375 mm/m2) 600 mg IV day #1.  2. Cytoxan (750 mg/m2) 125 mg IV day #2.  3. Adriamycin (50 mg/m2) 80 mg IV day #2.  4. Para vincristine 2 mg IV bolus day #2.  5. Prednisone 80 mg p.o. daily, day 2-6.   She tolerated chemo pretty well.  Rituxan went very, very slowly.  She  did okay with the Rituxan.   She got through chemotherapy without problems.  She was again transfused  because of anemia with her hemoglobin of 7.2.   She began to improve on the first.  She was constipated.  She was given  some bowel program which helped.   She had lab work done on June 17, 2008, which showed a white count 2.7,  hemoglobin 8.4, hematocrit 24.5, platelet count 36,000.  For unknown  reasons, her platelet count dropped quite a bit after the chemotherapy.  Her platelets have started to come up by nicely.  She never any problems  bleeding.   She had a normal LDH of 202.  Metabolic panel on the June 17, 2008,  showed a sodium of 137, potassium 4.2, BUN 9, creatinine 0.52.  Her LFTs  were normal.  Albumin was only 2.8.  Calcium was 7.8.  She had normal  uric acid.  I did put her on allopurinol to help with any potential  tumor lysis.   Of note, she had markedly low retic count, indicative of extensive  marrow involvement.  She had negative direct Coombs test.  Her stools  were negative for blood.   I felt that she could probably go home on June 18, 2008.  She was  getting Neupogen.  I will try to give her Neulasta as an outpatient.   I am going to put her  on ciprofloxacin as an outpatient as a means of  antibiotic prophylaxis.   PHYSICAL EXAMINATION:  VITAL SIGNS:  Upon discharge showed a temperature  of 98.4, pulse 60, respiratory rate 20, blood pressure is 124/56.  Oxygen saturation on room air was 90%.  HEAD/NECK:  Shows no oral lesions.  There was some scattered adenopathy  in neck.  LUNGS:  Clear bilaterally.  CARDIAC:  Regular rate and rhythm with normal S1-S2.  She had 1/6  systolic ejection murmur.  ABDOMEN:  Showed a soft abdomen.  There was slight distention.  Her  spleen certainly did not feel as pronounced.  There was still some  splenomegaly.  The spleen tip was probably about 5-6 below the left  costal margin.  There is no hepatomegaly.  I could not appreciate any  inguinal adenopathy bilaterally.  EXTREMITIES:  Shows no clubbing, cyanosis or edema.  SKIN:  No rashes, ecchymosis or petechia.  NEUROLOGICAL:  No focal neurological deficits.      Rose Phi. Myna Hidalgo, M.D.  Electronically Signed     PRE/MEDQ  D:  06/18/2008  T:  06/18/2008  Job:  [id number]   cc:   [name], M.D.  Fax: [phone]   Angelia Mould. Derrell Lolling, M.D.  [address]  [place]  [place] [address]   Rose Phi. Myna Hidalgo, M.D.  Fax: [phone]

## 2010-09-01 NOTE — Op Note (Signed)
Angel Dunlap, Angel Dunlap              ACCOUNT NO.:  [id number]   MEDICAL RECORD NO.:  [id number]          PATIENT TYPE:  INP   LOCATION:  5156                         FACILITY:  [facility]   PHYSICIAN:  Angelia Mould. Derrell Lolling, M.D.DATE OF BIRTH:  29-Jan-1971   DATE OF PROCEDURE:  06/12/2008  DATE OF DISCHARGE:                               OPERATIVE REPORT   PREOPERATIVE DIAGNOSIS:  B-cell lymphoma.   POSTOPERATIVE DIAGNOSIS:  B-cell lymphoma.   OPERATION PERFORMED:  1. Excision, deep left axillary lymph node.  2. Insertion of X-port venous vascular access device.   SURGEON:  Angelia Mould. Derrell Lolling, MD   OPERATIVE INDICATIONS:  This is a 40 year old African American female,  who was admitted about 5 days ago with a history of recent cold, cough,  shortness of breath, chest discomfort, sputum production, chills, and  weight loss.  She has developed progressive fatigue.  She was noted to  have a leukocytosis.  She was also noted to have peripheral  lymphadenopathy.  She was admitted by Dr. [name].  She was seen  by Dr. Arlan Organ, who raised the concern of lymphoma.  We were asked  to evaluate her.  CT scan showed gallstones, splenomegaly, and enlarged  lymph nodes in a variety of areas.  Physical exam showed a very small  body habitus, bilateral axillary adenopathy, most prominent on the left.  She has had a bone marrow aspiration, which suggests a B-cell lymphoma.  Dr. Arlan Organ has requested that I insert a Port-A-Cath and that I  excise one of the left axillary lymph nodes.  The patient was counseled  regarding both of these procedures including the risks involved and she  is in full agreement and all of her questions were answered.   OPERATIVE TECHNIQUE:  Following the administration of general  endotracheal anesthesia, intravenous antibiotics were given.  The  patient was identified as correct patient and correct procedure.  The  left chest and axilla were prepped and draped in  a sterile fashion.  Marcaine 0.25% with epinephrine was used a local infiltration  anesthetic.  A transverse incision was made in the left axilla and one  of the skin creases.  Dissection was carried down through the  subcutaneous tissue.  The clavipectoral fascia was incised.  I entered  the axillary space.  There were 2 enlarged lymph nodes side by side and  I slowly dissected both of these together out of the axilla using  electrocautery to control small vessels and lymphatics.  The lymph nodes  were sent to the lab in a fresh condition, and a lymphoma workup was  requested.  The wound was completely dry.  The wound was irrigated with  saline.  The clavipectoral fascia and subcutaneous tissue were closed  with interrupted sutures of 3-0 Vicryl, and the skin was closed with a  running subcuticular suture of 4-0 Monocryl and Steri-Strips.  Clean  bandage was placed.   The patient was then repositioned with the arms at her side and a small  roll behind her shoulders.  The neck and chest and breast were prepped  and  draped in a sterile fashion.  Marcaine 0.25% with epinephrine used  as a local infiltration anesthetic.  A right subclavian venipuncture was  attempted.  After 3 passes, I got no blood.  I then abandoned that  approach and performed a right internal jugular venipuncture with a  single pass and got good blood return and inserted a guidewire into the  superior vena cava under fluoroscopic guidance.  A small transverse  incision was made at the wire insertion site.  Fluoroscopy confirmed  that the wire was in the vena cava.  I then made a transverse incision  in the right infraclavicular area approximately 2 cm below the clavicle.  Subcutaneous pocket was created.  Using a tunneling device, I had  tunneled the catheter between the wire insertion site of the neck and  the port pocket site in the infraclavicular area.  I used fluoroscopy to  mark on the chest wall where the tip of  the catheter should be in the  superior vena cava right at the right atrial junction.  Using this  marking, I cut the catheter in appropriate length and it was 19 cm.  I  secured the catheter onto the port with a locking device and flushed the  entire system with concentrated heparin.  The port was sutured into the  pectoralis fascia with 3 interrupted sutures of 2-0 Prolene.  With the  patient back in Trendelenburg position, I inserted the dilator and peel-  away sheath assembly over the guidewire into the central venous  circulation.  The dilator and the wire were removed.  I had excellent  blood return.  I threaded the catheter through the peel-away sheath into  the superior vena cava and then removed the peel-away sheath.  The  catheter flushed easily and had excellent blood return.  Fluoroscopy  confirmed that the tip of the catheter was in the superior vena cava  just above the right atrium and there was no deformity of the catheter  anywhere.  I then flushed the port one more time with concentrated  heparin.  The subcutaneous tissue was closed with interrupted sutures of  3-0 Vicryl and the skin incisions were closed with subcuticular sutures  of 4-0 Monocryl and Steri-Strips.  Clean bandages were placed and the  patient was taken to recovery room in stable condition.  Estimated blood  loss was about 15-20 mL.  Complications none.  Sponge, needle, and  instrument counts were correct.      Angelia Mould. Derrell Lolling, M.D.  Electronically Signed     HMI/MEDQ  D:  06/12/2008  T:  06/13/2008  Job:  [id number]   cc:   Rose Phi. Myna Hidalgo, M.D.

## 2010-09-01 NOTE — Consult Note (Signed)
Angel Dunlap, Angel Dunlap              ACCOUNT NO.:  [id number]   MEDICAL RECORD NO.:  [id number]          PATIENT TYPE:  INP   LOCATION:  2039                         FACILITY:  [facility]   PHYSICIAN:  Currie Paris, M.D.DATE OF BIRTH:  Dec 18, 1970   DATE OF CONSULTATION:  06/08/2008  DATE OF DISCHARGE:                                 CONSULTATION   REASON FOR CONSULTATION:  Lymphadenopathy and possible lymph node  biopsy.   CLINICAL HISTORY:  [name] is a 40 year old African American Angel who  presented to the emergency room and was admitted with a 5-day history of  progressively worsening cold, cough, shortness of breath, chest  discomfort, and yellowish sputum with some associated chills.  She  noticed to have night sweats and a 60-pound weight loss over the last  year.  She had been getting progressively more tired and weak and noted  that her abdomen had become more bloated.  When she was evaluated in the  emergency department, she was noted to be mildly hypoxic with a white  count of 36,000 and a hemoglobin of 5.7 and what appeared to be by chest  x-ray, a right lower lobe pneumonia.  She was also noted to have marked  lymphadenopathy.   Since her hospitalization, she has been further evaluated and is getting  ready to be transfused.  She has been seen by Dr. Myna Hidalgo from Oncology  and concern has been raised that she may have a lymphoma given her  elevated white count, diffuse adenopathy, and significant splenomegaly.   PAST MEDICAL HISTORY:  She has been noted to have hypertension,  depression, asthma, and tobacco use.   OPERATIONS:  Only C-sections.   MEDICATIONS:  She had been taking Singulair, Ecotrin, Cymbalta,  trazodone, lisinopril, and hydrochlorothiazide.   ALLERGIES:  PENICILLIN.   FAMILY HISTORY:  She is separated from her husband.  She works as a  Theatre manager.  She smokes and occasionally drinks.  She does not use  illicit drugs.   REVIEW OF  SYSTEMS:  HEENT:  Noted that she has braces.  NECK:  No  particular symptoms.  LUNGS:  Dyspnea is noted with productive cough.  HEART:  No history of murmurs or hypertension.  ABDOMEN:  Some abdominal  bloating and some discomfort in the left upper quadrant.  GU:  Negative.  EXTREMITIES:  Negative.   PHYSICAL EXAMINATION:  VITAL SIGNS:  Of note, when she was admitted, she  had a blood pressure of 147/81, heart rate is 81, temperature is 98.2,  and O2 sats on 2 L of 99%.  Today, at the time I have seen her, she has  had a blood pressure of 175/76, heart rate of about 110, respirations  are 24, and temp 99.4.  GENERAL:  The patient is alert and oriented, slightly dyspneic, but not  toxic appearing.  HEAD:  Normocephalic.  EYES:  Nonicteric.  Pupils are equal, round, and regular.  NECK:  Supple.  There are multiple cervical nodes more on the left than  the right.  On the right side, they are on the posterior triangle.  There are some on the left side of the posterior and anterior triangle.  They appear to be about a cm or so, fairly hard, mobile, and nontender.  There is no thyromegaly noted.  LUNGS:  Increased respiratory rate with some decreased breath sounds at  the right base.  HEART:  Regular rhythm.  I  hear no murmurs, rubs, or gallops.  She has  normal pulses at her carotid and dorsalis pedis, posterior tibial.  ABDOMEN:  Soft and distended.  Spleen is palpable.  Bowel sounds are  noted.  I do not appreciate any hernias.  EXTREMITIES:  No edema. Not tender. BS +.   LABORATORY DATA:  Currently, her hemoglobin is 6 with a white count of  28,000.  Electrolytes some mild hyperkalemia of 3.4.  Liver functions  were normal, but her albumin is 3.0.  She has had some other  immunoglobulins and hep panels, etc that are pending.   X-RAYS:  She has had an MRI of the brain which is essentially negative.  Abdomen and pelvis showed marked adenopathy as well as marked  splenomegaly.  There  was also infiltrate consolidation of the right  lower lobe.   IMPRESSION:  1. Diffuse adenopathy, uncertain etiology, worrisome for lymphoma.  2. Right lower lobe pneumonia.  3. Marked anemia possibly related to lymphoma.  4. Leukocytosis, again possibly related to lymphoma.  5. Splenomegaly, again associated with her lymphadenopathy.  6. Thrombocytopenia.  7. Cholelithiasis (noted on CT scan incidentally).  8. History of asthma.  9. History of depression.  10.History of hypertension.   PLAN:  I think a lymph node biopsy would be appropriate, and she will  need to have an excisional biopsy.  This needs to be done during the  week when pathology is here and able to do a full lymphoma workup, which  is not possible over the weekends.  In addition, I think she will need  to be done with at least IV sedation and we will need to be sure her  pneumonia is stable and her respiratory status is stable enough to at  least tolerates some IV sedation.  It is possible we could get either  some cervical or one of very low axillary lymph nodes under acistrate  local anesthesia.  I think she is also scheduled for a bone marrow.   We will reevaluate her tomorrow.  She is scheduled for transfusions  today.  I have discussed the situation of the patient, she understands  what would need to be done.  I have discussed with her the fact that one  of my associates probably Dr. Derrell Lolling who will be the hospital doctor  next week would be the one to do the surgery and may need to reevaluate  her on Monday before making a final decision about surgery.      Currie Paris, M.D.  Electronically Signed     CJS/MEDQ  D:  06/08/2008  T:  06/09/2008  Job:  [id number]   cc:   [name], MD
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
## 2010-09-01 NOTE — Consult Note (Signed)
Angel Dunlap, Angel Dunlap              ACCOUNT NO.:  [id number]   MEDICAL RECORD NO.:  [id number]          PATIENT TYPE:  INP   LOCATION:  6522                         FACILITY:  [facility]   PHYSICIAN:  Rose Phi. Myna Hidalgo, M.D. DATE OF BIRTH:  03-11-1971   DATE OF CONSULTATION:  06/07/2008  DATE OF DISCHARGE:                                 CONSULTATION   REFERRING PHYSICIAN:  [name], M.D   REASON FOR CONSULTATION:  1. Lymphocytosis.  2. Severe normochromic normocytic anemia.  3. Diffuse lymphadenopathy.   HISTORY OF PRESENT ILLNESS:  Angel Dunlap is a very charming 40 year old  African American female who has had no real significant past medical  history.  She does have some hypertension and, I think, diabetes.  There  is a past history of alcohol abuse.  Apparently, she does have history  of suicidal attempt back in March 2009.   She has worked at [employer] for the past year and has been enjoying it.   She was admitted because of shortness of breath and cough.  She had  palpable lymph glands.  She has not been feeling all that well.   On admission, she had a lab work done.  The lab work showed a sodium of  135, potassium 3.6, BUN 5, and creatinine 0.64.  Her calcium is 8.6 and  albumin 3.5.  LFTs were normal.  Her CBC showed a white cell count of  36, hemoglobin 5.7, hematocrit 17.3, and platelet count was 75,000.  She  had MCV of 94.  Pregnancy test was negative.  LDH was done on 19th.  This is was normal at 193.  TSH was done, which was also normal at 0.53.  An anemia panel which showed a low retic count.  She had a normal  vitamin B12 of 815.  Serum folate was 5.4.  Her ferritin was only 48.  Her iron saturation was 33%.  She had blood cultures done which were  negative.  A syphilis screen was nonreactive.  HIV test was done, which  was nonreactive.  A sed rate was normal at 16.  She had direct Coombs  test which was also negative.   She has been transfused with packed red blood  cells.  MRI of the brain  has been done.  This showed no intracranial abnormalities or evidence of  lymphoma.  The orbital globes were incompletely evaluated due to  artifact from the patient's braces.  However, there is no obvious  abnormality.  A chest x-ray was done on 19th, which show worsening of  bilateral patchy pneumonia.  She had CT angiogram of the chest, which  showed no pulmonary embolism.  She had extensive lymphadenopathy.  She  had massive splenomegaly with the spleen measuring 22 x 11 x 18.  There  was no thyromegaly.  She had bulky retroperitoneal and peripheral lymph  nodes.  The bone density unremarkable.   We are asked to see her for evaluation.  Currently, she denies any  fevers, sweats, or chills.  There has been no weight loss.  She does  state a desired weight loss  of a year and a half of 70 pounds.  She also  felt there were some swollen lymph glands in her neck.  She has had no  hemoptysis, no hematemesis.  There has been no change in bowel or  bladder habits.  She has noticed some lymph nodes in her groin.  There  are no __________.  There are no rashes.  There has been no pruritus.   PAST MEDICAL HISTORY:  Remarkable for:  1. Hypertension.  2. Depression.  3. Asthma.  4. History of alcohol abuse.   ALLERGIES:  PENICILLIN.   ADMISSION MEDICATIONS:  1. Aspirin 81 mg daily.  2. Singulair 10 mg daily.  3. Cymbalta.  4. Trazodone.  5. Lisinopril.  6. Hydrochlorothiazide.  She has not been taking her medications.   SOCIAL HISTORY:  Remarkable for tobacco use.  She smokes about 2 packs  for the past 12 years.  There has been some alcohol use.  She denies any  IV drug use or recreational drug use.   FAMILY HISTORY:  Unremarkable.   PHYSICAL EXAMINATION:  GENERAL:  This is a somewhat ill-appearing black  female in no obvious distress.  VITAL SIGNS:  Temperature 98, pulse 83, respiratory rate 20, blood  pressure is 126/65.  HEAD:  Normocephalic and  atraumatic skull.  There is shotty bilateral  lymphadenopathy in her neck.  No lymph nodes measuring more than 1 cm.  She has some apparent proptosis with left greater than right.  Pupils  react appropriately.  NECK:  Her thyroid is not palpable.  LUNGS:  Clear to percussion and auscultation bilaterally.  There are no  rales that I could tell.  CARDIAC:  Regular rate and rhythm with normal S1 and S2.  There are no  murmurs, rubs, or bruits.  ABDOMEN:  Soft abdomen with good bowel sounds.  She does have some  splenomegaly.  Her spleen tip is about 7-8 cm below the left costal  margin.  Her liver edge is nonpalpable.  She does have bilateral  inguinal lymphadenopathy.  Her inguinal lymph nodes probably measure  about 1 cm.  EXTREMITIES:  No clubbing, cyanosis, or edema.  SKIN:  No rashes, ecchymosis, or petechiae.  NEUROLOGIC:  No focal neurological deficits.   Peripheral smear shows mild anisocytosis and poikilocytosis.  She has  hypochromic red cells, few teardrop cells.  I do not see any nucleated  red blood cells.  She has no new bone formation.  White cells are  markedly increased in number.  Majority of the white cells are clean,  small leukocytes.  I do not see any blasts.  Platelets were decreased in  number.   IMPRESSION:  Angel Dunlap is a very nice 40 year old African American  female with marked anemia.  She has lymphocytosis.   She has marked lymphocytosis.  Lymphocytes appear to be small, cleaned,  and well differentiated.   One has to suspect an underlying lymphoma in this situation.  She is  awfully young for an indolent lymphoma, but this certainly could be a  possibility.  __________ is always another possibility given her 40  age although one would expect her LDH to be quite high.   She clearly is going to need to have a bone marrow test done.  The  earliest this probably could be done would be on Tuesday.   I have spoken to Surgery about lymph node biopsy.   We need an excisional  node biopsy, so that we can send material  for flow cytometry and  cytogenetics and other tests.   We will send off the peripheral blood for flow cytometry.   Again, I do not see any evidence of hemolysis.  Her retic count is quite  low.  I am going to correct it for her hemoglobin.   Iron deficiency might be an issue.  Again, the bone marrow biopsy will  tell us this.   I spoke to Angel Dunlap at length.  She has an understanding of this.  She  understands what is going to need to be done.   Once we get the results back from studies, we will be able to make  treatment plans accordingly.      Rose Phi. Myna Hidalgo, M.D.  Electronically Signed     PRE/MEDQ  D:  06/07/2008  T:  06/08/2008  Job:  [id number]   cc:   [name], M.D.

## 2010-09-01 NOTE — Op Note (Signed)
[name], [name]              ACCOUNT NO.:  [id number]   MEDICAL RECORD NO.:  [id number]          PATIENT TYPE:  AMB   LOCATION:  OMED                         FACILITY:  [facility]   PHYSICIAN:  Rose Phi. Myna Hidalgo, M.D. DATE OF BIRTH:  May 26, 1970   DATE OF PROCEDURE:  11/05/2008  DATE OF DISCHARGE:                               OPERATIVE REPORT   .   NATURE OF THE PROCEDURE:  Left posterior iliac crest bone marrow biopsy  and aspirate.   [name] was brought to the short stay unit.  She had her Port-A-Cath  accessed without difficulty.   Her Mallampati score was 1.  Her ASA class was I.   She was placed onto her right side.  She received a total of 7 mg of  Versed and 25 mg of Demerol for IV sedation.   The left posterior iliac crest region was prepped and draped in sterile  fashion.  The 10 mL of 2% lidocaine was infiltrated under the skin down  to the periosteum.   A #11 scalpel was used to make an incision into the skin.  Two bone  marrow aspirates were obtained without difficulty.   We then obtained an excellent bone marrow biopsy core.   There were no complications.   [name] tolerated the procedure very well.      Rose Phi. Myna Hidalgo, M.D.  Electronically Signed     PRE/MEDQ  D:  11/05/2008  T:  11/05/2008  Job:  [id number]

## 2010-09-01 NOTE — H&P (Signed)
Angel Dunlap              ACCOUNT NO.:  [id number]   MEDICAL RECORD NO.:  [id number]          PATIENT TYPE:  INP   LOCATION:  6522                         FACILITY:  [facility]   PHYSICIAN:  Angel Dunlap, M.D.    DATE OF BIRTH:  12-05-1970   DATE OF ADMISSION:  06/06/2008  DATE OF DISCHARGE:                              HISTORY & PHYSICAL   PRESENTING COMPLAINTS:  Cough, cold, and shortness of breath.   HISTORY OF PRESENT ILLNESS:  [name] is a 40 year old African  American Angel with past medical history significant for systemic  hypertension, depression, bronchial asthma, tobacco abuse, and uterine  fibroids.  She came to the emergency room at [facility] with 5  days' history of progressively worsening cold and cough associated with  shortness of breath, chest discomfort, and cough productive of yellowish  sputum.  She had no hemoptysis.  She did have some chills, but no  fevers.  She admitted to having night sweats on and off for several  months and had lost about 60 pounds of weight in the last year.  She has  been tired and weak over the last few months, but she attributed this to  lack of adequate sleep and rest.  She is having good appetite and she is  eating mainly junk foods.  She had noted that her abdomen had been  progressively more bloated and swollen the last week, became painful in  the left upper quadrant, but she denied having any nausea, vomiting,  diarrhea, but had episodes of constipation for which, she used over-the-  counter laxatives.  She had no urinary symptoms.  She denies having any  joint pains or joint swellings.  She has no headaches, dizziness,  blurring of vision, weakness of extremities, or slurring of speech.  However, at the emergency room today, she was noted to be mildly hypoxic  and she was started on oxygen.  Vital signs otherwise were stable.  Initial laboratory data showed a markedly elevated white count at 36,000  and anemia  with hemoglobin of 5.7.  Chest x-ray showed right lung base  opacity.  Due to elevated D-dimer at 8.31, a CT scan of the chest was  performed and this revealed no evidence of pulmonary embolism, but there  were multiple axillary, supraclavicular, mediastinal, and hilar  lymphadenopathy concerning for lymphoma and right lower lobe  consolidation consistent with pneumonia and early pneumonia in the right  upper lobe.  This was extended into the abdomen and the pelvis revealing  massive splenomegaly calculated at 21.80 mL and bulky intraperitoneal  and periportal lymphadenopathy again consistent with lymphoma,  cholelithiasis, as well as inguinal iliac adenopathy again consistent  with lymphoma.  She was admitted to the medical floor for treatment of  pneumonia and for further workup.   PAST MEDICAL HISTORY:  1. Systemic hypertension.  She has not been compliant with her      medications.  2. Depression.  3. Bronchial asthma.  4. Tobacco abuse.  5. Uterine fibroids with heavy menstruation in the past.   PAST SURGICAL HISTORY:  C-sections.  MEDICATION HISTORY:  She was supposed to be on Singulair, Ecotrin,  Cymbalta, trazodone, lisinopril and hydrochlorothiazide, but she has  apparently not been taking all these medicines in the last few months.  She only uses Cymbalta and trazodone, which she gets from her  psychiatrist.  The last visit to my office was in May 2009.   ALLERGIES:  She is allergic to PENICILLIN.   FAMILY AND SOCIAL HISTORY:  She is separated from her husband.  Two boys  live with her husband.  She works as a Theatre manager at [employer], but  has not been able to work in the last week.  She smokes about 2-pack of  mild cigarettes daily.  She uses alcohol socially.  She denies any use  of illicit drugs.   REVIEW OF SYSTEMS:  Essentially as above.   PHYSICAL EXAMINATION:  GENERAL:  She looks older than her age.  She is  anxious with mild exophthalmos.  She is not in  any acute respiratory or  painful distress.  She is pale.  She is not icteric.  She is not  cyanosed.  She is well hydrated.  She is afebrile.  VITAL SIGNS:  Blood pressure 147/81, heart rate of 81, respiratory rate  of 18, temperature 98.2, O2 sats on 2 L for nasal cannula is 99% .  NECK:  Supple with no elevated JVD.  SKIN:  No rashes.  There are multiple palpable lymph nodes in the  supraclavicular, axillary, left retroauricular, inguinal regions  bilaterally  CHEST:  Good air entry bilaterally with few rales in the right chest.  No rhonchi or wheezes.  HEART:  Sounds were not heard with no murmurs, no S3 gallops.  ABDOMEN:  Distended.  There is tenderness over the left upper quadrant  with enlarged palpable spleen.  Bowel sounds are present.  EXTREMITIES:  No edema or calf tenderness.  CNS: She is alert, oriented x3 with no focal neurological deficits.   LABORATORY DATA:  D-dimer is 8.31.  White count is 36.3 with 88%  lymphocytes and 12% polys and no bands, hemoglobin is 5.7, hematocrit  17.3, platelet count of 75,000.  Sodium is 135, potassium 3.6, chloride  104, bicarbonate of 23, BUN is 5, creatinine 0.64, and glucose is 75.  LFTs are within normal limits.  Urinalysis negative.  Pregnancy test is  negative.  CT angiogram shows no pulmonary embolism.   ASSESSMENT:  [name] is a 40 year old African American Angel with  medical problems described above.  She has been admitted to the hospital  for treatment of her pneumonia and for further evaluation of her  hematology condition.   ADMISSION DIAGNOSES:  1. Right-sided pneumonia.  2. Symptomatic anemia.  3. Leukocytosis with lymphocytosis.  4. Splenomegaly masses with diffuse lymphadenopathy.  5. Thrombocytopenia.   The constellation of symptoms as well as findings on blood work and CT  scan is highly suggestive of lymphoma.   PLAN OF CARE:  She is being admitted to a telemetry bed.  She will be on  IV Rocephin 1 g daily,  IV azithromycin 500 mg daily.  She received  nebulized albuterol p.r.n.  Further laboratory workup will include an  LDH, sed rate, hepatitis panel, HIV tests, RPR, serum protein  electrophoresis, and peripheral blood smear.  She will receive 1 unit  of packed red blood cell transfusion.  Hematology and Oncology consult  will be requested in a.m. and also a Surgical consult for lymph node  biopsy.  This findings  and plan of care has been discussed with the  patient and her questions answered.      Angel Dunlap, M.D.  Electronically Signed     EA/MEDQ  D:  06/06/2008  T:  06/07/2008  Job:  [id number]
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
## 2010-09-25 ENCOUNTER — Encounter (HOSPITAL_BASED_OUTPATIENT_CLINIC_OR_DEPARTMENT_OTHER): Payer: Medicaid Other | Admitting: Hematology & Oncology

## 2010-09-25 ENCOUNTER — Other Ambulatory Visit: Payer: Self-pay | Admitting: Hematology & Oncology

## 2010-09-25 DIAGNOSIS — C8298 Follicular lymphoma, unspecified, lymph nodes of multiple sites: Secondary | ICD-10-CM

## 2010-09-25 DIAGNOSIS — C8589 Other specified types of non-Hodgkin lymphoma, extranodal and solid organ sites: Secondary | ICD-10-CM

## 2010-09-25 DIAGNOSIS — Z5111 Encounter for antineoplastic chemotherapy: Secondary | ICD-10-CM

## 2010-09-25 LAB — COMPREHENSIVE METABOLIC PANEL
Albumin: 4.4 g/dL (ref 3.5–5.2)
Alkaline Phosphatase: 47 U/L (ref 39–117)
BUN: 10 mg/dL (ref 6–23)
CO2: 27 mEq/L (ref 19–32)
Glucose, Bld: 80 mg/dL (ref 70–99)
Potassium: 4.3 mEq/L (ref 3.5–5.3)
Sodium: 142 mEq/L (ref 135–145)
Total Protein: 6.3 g/dL (ref 6.0–8.3)

## 2010-09-25 LAB — CBC WITH DIFFERENTIAL (CANCER CENTER ONLY)
BASO#: 0.1 10*3/uL (ref 0.0–0.2)
Eosinophils Absolute: 0.1 10*3/uL (ref 0.0–0.5)
HGB: 14.3 g/dL (ref 11.6–15.9)
LYMPH%: 23.5 % (ref 14.0–48.0)
MCH: 33.4 pg (ref 26.0–34.0)
MCV: 97 fL (ref 81–101)
MONO#: 0.4 10*3/uL (ref 0.1–0.9)
MONO%: 4.3 % (ref 0.0–13.0)
NEUT#: 6.7 10*3/uL — ABNORMAL HIGH (ref 1.5–6.5)
Platelets: 241 10*3/uL (ref 145–400)
RBC: 4.28 10*6/uL (ref 3.70–5.32)
WBC: 9.6 10*3/uL (ref 3.9–10.0)

## 2010-09-25 LAB — LACTATE DEHYDROGENASE: LDH: 144 U/L (ref 94–250)

## 2011-01-01 ENCOUNTER — Other Ambulatory Visit: Payer: Self-pay | Admitting: Hematology & Oncology

## 2011-01-01 ENCOUNTER — Encounter (HOSPITAL_BASED_OUTPATIENT_CLINIC_OR_DEPARTMENT_OTHER): Payer: Medicare Other | Admitting: Hematology & Oncology

## 2011-01-01 DIAGNOSIS — Z5111 Encounter for antineoplastic chemotherapy: Secondary | ICD-10-CM

## 2011-01-01 DIAGNOSIS — C8589 Other specified types of non-Hodgkin lymphoma, extranodal and solid organ sites: Secondary | ICD-10-CM

## 2011-01-01 DIAGNOSIS — Z5112 Encounter for antineoplastic immunotherapy: Secondary | ICD-10-CM

## 2011-01-01 LAB — CBC WITH DIFFERENTIAL (CANCER CENTER ONLY)
BASO#: 0 10*3/uL (ref 0.0–0.2)
EOS%: 1.3 % (ref 0.0–7.0)
Eosinophils Absolute: 0.1 10*3/uL (ref 0.0–0.5)
HCT: 42.3 % (ref 34.8–46.6)
HGB: 15.2 g/dL (ref 11.6–15.9)
LYMPH%: 21.4 % (ref 14.0–48.0)
MCH: 34.5 pg — ABNORMAL HIGH (ref 26.0–34.0)
MCHC: 35.9 g/dL (ref 32.0–36.0)
MCV: 96 fL (ref 81–101)
MONO%: 7.2 % (ref 0.0–13.0)
NEUT#: 5.9 10*3/uL (ref 1.5–6.5)
NEUT%: 69.9 % (ref 39.6–80.0)
RBC: 4.4 10*6/uL (ref 3.70–5.32)

## 2011-01-01 LAB — COMPREHENSIVE METABOLIC PANEL
AST: 11 U/L (ref 0–37)
Alkaline Phosphatase: 53 U/L (ref 39–117)
BUN: 8 mg/dL (ref 6–23)
Creatinine, Ser: 0.78 mg/dL (ref 0.50–1.10)

## 2011-01-01 LAB — T4: T4, Total: 9.3 ug/dL (ref 5.0–12.5)

## 2011-01-01 LAB — TSH: TSH: 0.689 u[IU]/mL (ref 0.350–4.500)

## 2011-01-11 LAB — SALICYLATE LEVEL: Salicylate Lvl: <4

## 2011-01-11 LAB — BASIC METABOLIC PANEL
BUN: 4 — ABNORMAL LOW
GFR calc non Af Amer: >60
Potassium: 3.8
Sodium: 141

## 2011-01-11 LAB — URINE MICROSCOPIC-ADD ON

## 2011-01-11 LAB — URINALYSIS, ROUTINE W REFLEX MICROSCOPIC
Glucose, UA: NEGATIVE
Ketones, ur: 40 — AB
Leukocytes, UA: NEGATIVE
Specific Gravity, Urine: 1.021
pH: 6

## 2011-01-11 LAB — CBC
HCT: 32.9 — ABNORMAL LOW
Platelets: 304
WBC: 11.8 — ABNORMAL HIGH

## 2011-01-11 LAB — ETHANOL: Alcohol, Ethyl (B): <5

## 2011-01-11 LAB — ACETAMINOPHEN LEVEL: Acetaminophen (Tylenol), Serum: <10 — ABNORMAL LOW

## 2011-01-11 LAB — RAPID URINE DRUG SCREEN, HOSP PERFORMED: Cocaine: NOT DETECTED

## 2011-04-15 ENCOUNTER — Other Ambulatory Visit: Payer: Self-pay | Admitting: *Deleted

## 2011-04-15 DIAGNOSIS — F419 Anxiety disorder, unspecified: Secondary | ICD-10-CM

## 2011-04-15 MED ORDER — CLONAZEPAM 1 MG PO TABS: 1.0000 mg | ORAL_TABLET | Freq: Two times a day (BID) | ORAL | Status: DC | PRN

## 2011-05-05 ENCOUNTER — Other Ambulatory Visit (HOSPITAL_BASED_OUTPATIENT_CLINIC_OR_DEPARTMENT_OTHER): Payer: Medicare Other | Admitting: Lab

## 2011-05-05 ENCOUNTER — Ambulatory Visit (HOSPITAL_BASED_OUTPATIENT_CLINIC_OR_DEPARTMENT_OTHER): Payer: Medicare Other | Admitting: Hematology & Oncology

## 2011-05-05 ENCOUNTER — Other Ambulatory Visit: Payer: Self-pay | Admitting: *Deleted

## 2011-05-05 ENCOUNTER — Ambulatory Visit: Payer: Medicare Other

## 2011-05-05 VITALS — BP 136/100 | HR 73 | Temp 97.0°F | Ht 60.0 in | Wt 191.1 lb

## 2011-05-05 DIAGNOSIS — M810 Age-related osteoporosis without current pathological fracture: Secondary | ICD-10-CM

## 2011-05-05 DIAGNOSIS — C8300 Small cell B-cell lymphoma, unspecified site: Secondary | ICD-10-CM

## 2011-05-05 DIAGNOSIS — E559 Vitamin D deficiency, unspecified: Secondary | ICD-10-CM

## 2011-05-05 DIAGNOSIS — C8298 Follicular lymphoma, unspecified, lymph nodes of multiple sites: Secondary | ICD-10-CM

## 2011-05-05 LAB — CBC WITH DIFFERENTIAL (CANCER CENTER ONLY)
BASO#: 0 10*3/uL (ref 0.0–0.2)
Eosinophils Absolute: 0.2 10*3/uL (ref 0.0–0.5)
HCT: 42.2 % (ref 34.8–46.6)
HGB: 14.6 g/dL (ref 11.6–15.9)
LYMPH#: 3.3 10*3/uL (ref 0.9–3.3)
MONO#: 0.5 10*3/uL (ref 0.1–0.9)
NEUT#: 4.5 10*3/uL (ref 1.5–6.5)
NEUT%: 52.3 % (ref 39.6–80.0)
RBC: 4.29 10*6/uL (ref 3.70–5.32)
WBC: 8.5 10*3/uL (ref 3.9–10.0)

## 2011-05-05 MED ORDER — SODIUM CHLORIDE 0.9 % IJ SOLN
10.0000 mL | INTRAMUSCULAR | Status: DC | PRN
  Administered 2011-05-05: 10 mL via INTRAVENOUS
  Filled 2011-05-05: qty 10

## 2011-05-05 MED ORDER — HEPARIN SOD (PORK) LOCK FLUSH 100 UNIT/ML IV SOLN
500.0000 [IU] | Freq: Once | INTRAVENOUS | Status: AC
  Administered 2011-05-05: 500 [IU] via INTRAVENOUS
  Filled 2011-05-05: qty 5

## 2011-05-05 MED ORDER — CLONAZEPAM 1 MG PO TABS: 1.0000 mg | ORAL_TABLET | Freq: Two times a day (BID) | ORAL | Status: DC | PRN

## 2011-05-05 NOTE — Progress Notes (Signed)
CC:   [name], M.D.  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  CURRENT THERAPY:  Observation.  INTERIM HISTORY:  Ms. Mayford Knife is her for followup.  She has done very, very well.  She completed her Rituxan maintenance therapy back in September.  The patient enjoyed the holidays.  She is going to college now.  She is working out.  She has had no problems with fevers, sweats, or chills.  There has been no cough or shortness of breath.  She has had no bony pain.  There has been no change in bowel or bladder habits.  On her last visit, her TSH was 0.689.  Her T4 was 9.3.  PHYSICAL EXAMINATION:  General Appearance:  This is a well-developed, well-nourished, black female in no obvious distress.  Vital Signs: Temperature of 98.  Pulse is 90.  Respiratory rate 18.  Blood pressure 126/70.  Weight is 198.  Head and Neck Exam:  A normocephalic, atraumatic skull.  There are no ocular or oral lesions.  There may be some slight proptosis.  Her extraocular muscles are intact.  Pupils react appropriately.  Neck:  Supple with no adenopathy.  Thyroid is nonpalpable.  Lungs:  Clear bilaterally.  Cardiac Exam:  Regular rate and rhythm with a normal S1 and S2.  There are no murmurs, rubs, or bruits.  Abdominal Exam:  Soft with good bowel sounds.  There is no palpable abdominal mass.  There is no fluid wave.  There is no palpable hepatosplenomegaly.  Back exam:  No tenderness over the spine, ribs, or hips.  Extremities:  No clubbing, cyanosis, or edema.  Axillary Exam: No bilateral axillary adenopathy.  Skin Exam:  No rash, ecchymosis, or petechia.  LABORATORY STUDIES:  White cell count 8.6, hemoglobin 14.6, hematocrit 43.2, platelet count is 252.  IMPRESSION:  Ms. Mayford Knife is a 41 year old African American female with a history of follicular non-Hodgkin's lymphoma.  She had a follicular small cell lymphoma.  She really presented with incredibly  extensive disease.  She had splenomegaly.  She basically had every lymph node involved.  She had extensive bone marrow involvement.  When I first saw her, her FLIPI score was 4.  She underwent chemotherapy with R-CHOP.  She had 8 cycles.  She then underwent maintenance therapy with Rituxan.  She is doing quite well.  I do not see any evidence of recurrence.  I suppose there is always going to be that risk of recurrence.  She initially presented, I think, back in February of 2010.  We will go ahead and plan to get her back in 3 months or so.  Again, I do not see any need for x-ray studies or additional lab work.    ______________________________ Josph Macho, M.D. PRE/MEDQ  D:  05/05/2011  T:  05/05/2011  Job:  [id number]

## 2011-05-05 NOTE — Progress Notes (Signed)
This office note has been dictated.

## 2011-05-06 LAB — COMPREHENSIVE METABOLIC PANEL
ALT: 8 U/L (ref 0–35)
Albumin: 4.4 g/dL (ref 3.5–5.2)
BUN: 17 mg/dL (ref 6–23)
CO2: 25 mEq/L (ref 19–32)
Calcium: 9.4 mg/dL (ref 8.4–10.5)
Chloride: 104 mEq/L (ref 96–112)
Creatinine, Ser: 1.03 mg/dL (ref 0.50–1.10)
Potassium: 4.2 mEq/L (ref 3.5–5.3)

## 2011-05-06 LAB — LACTATE DEHYDROGENASE: LDH: 169 U/L (ref 94–250)

## 2011-05-31 ENCOUNTER — Telehealth: Payer: Self-pay | Admitting: *Deleted

## 2011-05-31 NOTE — Telephone Encounter (Signed)
Message copied by Anselm Jungling on Mon May 31, 2011 12:27 PM ------      Message from: Arlan Organ R      Created: Thu May 27, 2011  6:55 PM       Vit D si too low.  Is she on any?? Please TELL me!!  pete

## 2011-05-31 NOTE — Telephone Encounter (Signed)
Called patient to let her know that her vitamin d is low and she needs to take 2000 u daily per dr. Myna Hidalgo. Encouraged patient compliance

## 2011-07-16 ENCOUNTER — Telehealth: Payer: Self-pay | Admitting: Hematology & Oncology

## 2011-07-16 NOTE — Telephone Encounter (Signed)
Faxed records to [employer] in response to request. °

## 2011-08-06 ENCOUNTER — Ambulatory Visit: Payer: Medicare Other | Admitting: Hematology & Oncology

## 2011-08-06 ENCOUNTER — Other Ambulatory Visit: Payer: Medicare Other | Admitting: Lab

## 2011-08-26 ENCOUNTER — Telehealth: Payer: Self-pay | Admitting: Hematology & Oncology

## 2011-08-26 NOTE — Telephone Encounter (Signed)
Pt called made 09-01-11 appointment

## 2011-09-01 ENCOUNTER — Ambulatory Visit (HOSPITAL_BASED_OUTPATIENT_CLINIC_OR_DEPARTMENT_OTHER): Payer: Medicare Other | Admitting: Hematology & Oncology

## 2011-09-01 ENCOUNTER — Other Ambulatory Visit (HOSPITAL_BASED_OUTPATIENT_CLINIC_OR_DEPARTMENT_OTHER): Payer: Medicare Other | Admitting: Lab

## 2011-09-01 ENCOUNTER — Telehealth (INDEPENDENT_AMBULATORY_CARE_PROVIDER_SITE_OTHER): Payer: Self-pay

## 2011-09-01 ENCOUNTER — Ambulatory Visit: Payer: Medicare Other

## 2011-09-01 ENCOUNTER — Telehealth: Payer: Self-pay | Admitting: Hematology & Oncology

## 2011-09-01 VITALS — BP 138/101 | HR 72 | Temp 97.7°F | Ht 62.0 in | Wt 186.0 lb

## 2011-09-01 DIAGNOSIS — C8298 Follicular lymphoma, unspecified, lymph nodes of multiple sites: Secondary | ICD-10-CM

## 2011-09-01 DIAGNOSIS — Z8572 Personal history of non-Hodgkin lymphomas: Secondary | ICD-10-CM

## 2011-09-01 DIAGNOSIS — C8238 Follicular lymphoma grade IIIa, lymph nodes of multiple sites: Secondary | ICD-10-CM

## 2011-09-01 HISTORY — DX: Personal history of non-Hodgkin lymphomas: Z85.72

## 2011-09-01 LAB — COMPREHENSIVE METABOLIC PANEL
AST: 31 U/L (ref 0–37)
Albumin: 4.3 g/dL (ref 3.5–5.2)
Alkaline Phosphatase: 49 U/L (ref 39–117)
BUN: 11 mg/dL (ref 6–23)
Potassium: 3.9 mEq/L (ref 3.5–5.3)
Sodium: 143 mEq/L (ref 135–145)
Total Bilirubin: 0.5 mg/dL (ref 0.3–1.2)

## 2011-09-01 LAB — CBC WITH DIFFERENTIAL (CANCER CENTER ONLY)
BASO#: 0 10*3/uL (ref 0.0–0.2)
Eosinophils Absolute: 0.1 10*3/uL (ref 0.0–0.5)
HCT: 41.3 % (ref 34.8–46.6)
LYMPH%: 33 % (ref 14.0–48.0)
MCH: 34.7 pg — ABNORMAL HIGH (ref 26.0–34.0)
MCV: 100 fL (ref 81–101)
MONO#: 0.5 10*3/uL (ref 0.1–0.9)
MONO%: 5.7 % (ref 0.0–13.0)
NEUT%: 59.6 % (ref 39.6–80.0)
Platelets: 265 10*3/uL (ref 145–400)
RBC: 4.15 10*6/uL (ref 3.70–5.32)
WBC: 8.2 10*3/uL (ref 3.9–10.0)

## 2011-09-01 NOTE — Progress Notes (Signed)
This office note has been dictated.

## 2011-09-01 NOTE — Telephone Encounter (Signed)
The got a request for porta cath removal from Dr Myna Hidalgo to be done in interventional radiology.  Dr Derrell Lolling placed it in 2010 so they need his ok for them to do it.  You can call Inetta Fermo back tomorrow

## 2011-09-01 NOTE — Progress Notes (Signed)
Angel Dunlap presented for Portacath access and flush. Proper placement of portacath confirmed by CXR. Portacath located in the right chest wall accessed with  H 20 needle. Clean, Dry and Intact Good blood return present. Portacath flushed with 20ml NS and 500U/54ml Heparin per protocol and needle removed intact. Procedure without incident. Patient tolerated procedure well.

## 2011-09-01 NOTE — Telephone Encounter (Signed)
Pt aware Tina with IR will call her with port removal appointment

## 2011-09-02 ENCOUNTER — Telehealth (INDEPENDENT_AMBULATORY_CARE_PROVIDER_SITE_OTHER): Payer: Self-pay

## 2011-09-02 NOTE — Progress Notes (Signed)
CC:   Angel Dunlap, M.D.  DIAGNOSIS:  Stage IV follicular small-cell lymphoma, remission clinically.  CURRENT THERAPY:  Observation.  INTERIM HISTORY:  Angel Dunlap comes in for her followup.  She is done with school for the year.  She may go back this summer, but she will try to take off a couple of weeks if she can.  She has had no problems with fatigue.  There have been no fevers.  She has had some hot flashes and sweats.  She thinks she is going through the change of life.  She did have a hysterectomy with I think 1 ovary left in.  She has had no rashes.  There has been no diarrhea.  She has had no constipation.  She does have a Port-A-Cath in that we need to get out.  PHYSICAL EXAMINATION:  This is a somewhat obese black female in no obvious distress.  Vital signs:  Temperature 97.7, pulse 72, respiratory rate 20, blood pressure 138/101.  Weight is 186.  Head and neck:  A normocephalic, atraumatic skull.  There are no ocular or oral lesions. There are no palpable cervical or supraclavicular lymph nodes.  Lungs: Clear to percussion and auscultation bilaterally.  Cardiac:  Regular rate and rhythm with a normal S1 and S2.  There are no murmurs, rubs or bruits.  Abdomen:  Soft with good bowel sounds.  There is no palpable abdominal mass.  There is no fluid wave.  There is no palpable hepatosplenomegaly.  Back:  No tenderness over the spine, ribs, or hips. Extremities:  No clubbing, cyanosis or edema.  Neurologic:  No focal neurological deficits.  Skin:  No rashes, ecchymosis or petechia.  LABORATORY STUDIES:  White cell count is 8.2, hemoglobin 14.4, hematocrit 41.3, platelet count 265.  White cell differential shows 60 segs, 33 lymphs, 6 monos.  IMPRESSION:  Angel Dunlap is a 41 year old African American female with a history of stage IV follicular small-cell lymphoma.  She has responded beautifully to systemic chemotherapy.  She underwent chemotherapy with R-CHOP.   She completed 8 cycles, followed by maintenance Rituxan.  She completed her Rituxan last year.  Her FLIPI score was 4, so there definitely is that risk of recurrence. However, I do not see that we have to put her through scans right now.  We will get her Port-A-Cath taken out.  I will plan to get her back in 4 months for followup.    ______________________________ Josph Macho, M.D. PRE/MEDQ  D:  09/01/2011  T:  09/02/2011  Job:  [id number]

## 2011-09-02 NOTE — Telephone Encounter (Signed)
Message copied by Ivory Broad on Thu Sep 02, 2011  8:10 AM ------      Message from: Ernestene Mention      Created: Thu Sep 02, 2011  6:46 AM       OK to remove port.

## 2011-09-02 NOTE — Telephone Encounter (Signed)
I called and notified Inetta Fermo at Interventional Radiology that it is ok to remove the port

## 2011-09-03 ENCOUNTER — Other Ambulatory Visit: Payer: Self-pay | Admitting: Radiology

## 2011-09-06 ENCOUNTER — Other Ambulatory Visit: Payer: Self-pay | Admitting: Physician Assistant

## 2011-09-06 ENCOUNTER — Telehealth (HOSPITAL_COMMUNITY): Payer: Self-pay | Admitting: Hematology & Oncology

## 2011-09-07 ENCOUNTER — Ambulatory Visit (HOSPITAL_COMMUNITY)
Admission: RE | Admit: 2011-09-07 | Discharge: 2011-09-07 | Disposition: A | Discharge status: Home / Self Care | Payer: Medicare Other | Source: Ambulatory Visit | Attending: Hematology & Oncology | Admitting: Hematology & Oncology

## 2011-09-07 VITALS — BP 121/81 | HR 65 | Temp 98.4°F | Resp 16

## 2011-09-07 DIAGNOSIS — Z452 Encounter for adjustment and management of vascular access device: Secondary | ICD-10-CM | POA: Insufficient documentation

## 2011-09-07 DIAGNOSIS — J45909 Unspecified asthma, uncomplicated: Secondary | ICD-10-CM | POA: Insufficient documentation

## 2011-09-07 DIAGNOSIS — C8238 Follicular lymphoma grade IIIa, lymph nodes of multiple sites: Secondary | ICD-10-CM

## 2011-09-07 DIAGNOSIS — C8589 Other specified types of non-Hodgkin lymphoma, extranodal and solid organ sites: Secondary | ICD-10-CM | POA: Insufficient documentation

## 2011-09-07 DIAGNOSIS — Z79899 Other long term (current) drug therapy: Secondary | ICD-10-CM | POA: Insufficient documentation

## 2011-09-07 DIAGNOSIS — I10 Essential (primary) hypertension: Secondary | ICD-10-CM | POA: Insufficient documentation

## 2011-09-07 LAB — PROTIME-INR
INR: 0.82 (ref 0.00–1.49)
Prothrombin Time: 11.5 seconds — ABNORMAL LOW (ref 11.6–15.2)

## 2011-09-07 MED ORDER — SODIUM CHLORIDE 0.9 % IV SOLN
INTRAVENOUS | Status: DC
  Administered 2011-09-07: 250 mL via INTRAVENOUS

## 2011-09-07 MED ORDER — FENTANYL CITRATE 0.05 MG/ML IJ SOLN
INTRAMUSCULAR | Status: AC | PRN
  Administered 2011-09-07: 100 ug via INTRAVENOUS

## 2011-09-07 MED ORDER — MIDAZOLAM HCL 5 MG/5ML IJ SOLN
INTRAMUSCULAR | Status: AC | PRN
  Administered 2011-09-07: 2 mg via INTRAVENOUS

## 2011-09-07 MED ORDER — VANCOMYCIN HCL IN DEXTROSE 1-5 GM/200ML-% IV SOLN
1000.0000 mg | INTRAVENOUS | Status: AC
  Administered 2011-09-07: 1000 mg via INTRAVENOUS

## 2011-09-07 MED ORDER — FENTANYL CITRATE 0.05 MG/ML IJ SOLN
INTRAMUSCULAR | Status: AC
  Filled 2011-09-07: qty 2

## 2011-09-07 MED ORDER — MIDAZOLAM HCL 2 MG/2ML IJ SOLN
INTRAMUSCULAR | Status: AC
  Filled 2011-09-07: qty 4

## 2011-09-07 MED ORDER — VANCOMYCIN HCL IN DEXTROSE 1-5 GM/200ML-% IV SOLN
INTRAVENOUS | Status: AC
  Filled 2011-09-07: qty 200

## 2011-09-07 NOTE — Procedures (Signed)
Successful removal of right anterior chest wall port-a-cath. No immediate post procedural complications.  

## 2011-09-07 NOTE — H&P (Signed)
Angel Dunlap is an 41 y.o. female.   Chief Complaint: "I'm here to get my port out" HPI: Patient with history of follicular lymphoma and prior port a cath placement for chemotherapy. She presents today following completion of chemotherapy for port removal.  Past Medical History  Diagnosis Date  . Personal history of malignant lymphoma 09/01/2011  HTN, depression, asthma, uterine fibroids  PSH: C sections No family history on file. Social History: smoker, occ alcohol, previously worked at [employer] Allergies:  Allergies  Allergen Reactions  . Penicillins Swelling and Rash    Current outpatient prescriptions:ALBUTEROL IN, Inhale into the lungs every morning., Disp: , Rfl: ;  Ascorbic Acid (VITAMIN C) 1000 MG tablet, Take 1,000 mg by mouth daily., Disp: , Rfl: ;  clonazePAM (KLONOPIN) 1 MG tablet, Take 1 mg by mouth 3 (three) times daily as needed., Disp: , Rfl: ;  clonazePAM (KLONOPIN) 1 MG tablet, Take 1 mg by mouth 3 (three) times daily as needed. May repeat x 1 in the evening as needed for sleep/anxiety, Disp: , Rfl:  CYMBALTA 30 MG capsule, daily. , Disp: , Rfl: ;  hydrochlorothiazide (HYDRODIURIL) 25 MG tablet, Take 25 mg by mouth daily., Disp: , Rfl: ;  lisinopril (PRINIVIL,ZESTRIL) 40 MG tablet, Take 40 mg by mouth daily., Disp: , Rfl: ;  metoprolol tartrate (LOPRESSOR) 25 MG tablet, Take 50 mg by mouth 2 (two) times daily. , Disp: , Rfl: ;  VITAMIN D, ERGOCALCIFEROL, PO, Take 1,000 mg by mouth every morning., Disp: , Rfl:  Current facility-administered medications:0.9 %  sodium chloride infusion, , Intravenous, Continuous, D Kevin Lyrick Worland, PA;  fentaNYL (SUBLIMAZE) 0.05 MG/ML injection, , , , ;  midazolam (VERSED) 2 MG/2ML injection, , , , ;  vancomycin (VANCOCIN) 1 GM/200ML IVPB, , , , ;  vancomycin (VANCOCIN) IVPB 1000 mg/200 mL premix, 1,000 mg, Intravenous, On Call, D Jeananne Rama, PA   Results for orders placed during the hospital encounter of 09/07/11 (from the past 48  hour(s))  PROTIME-INR     Status: Abnormal   Collection Time   09/07/11  7:04 AM      Component Value Range Comment   Prothrombin Time 11.5 (*) 11.6 - 15.2 (seconds)    INR 0.82  0.00 - 1.49     No results found.  Review of Systems  Constitutional: Negative for fever and chills.  Respiratory: Negative for cough and shortness of breath.   Cardiovascular: Negative for chest pain.  Gastrointestinal: Negative for nausea, vomiting and abdominal pain.  Musculoskeletal: Negative for back pain.  Neurological: Negative for headaches.  Endo/Heme/Allergies: Does not bruise/bleed easily.    Blood pressure 116/85, pulse 80, temperature 98.4 F (36.9 C), temperature source Oral, resp. rate 21, SpO2 96.00%. Physical Exam  Constitutional: She is oriented to person, place, and time. She appears well-developed and well-nourished.  Cardiovascular: Normal rate and regular rhythm.   Respiratory: Effort normal and breath sounds normal.  GI: Soft. Bowel sounds are normal. There is no tenderness.  Musculoskeletal: Normal range of motion. She exhibits no edema.  Neurological: She is alert and oriented to person, place, and time.     Assessment/Plan Patient with follicular lymphoma, s/p completion of chemotherapy; plan is for port a cath removal. Details/risks of above d/w pt/daughter with their understanding and consent.  Delainee Tramel,D KEVIN 09/07/2011, 8:12 AM

## 2012-01-03 ENCOUNTER — Other Ambulatory Visit: Payer: Medicare Other | Admitting: Lab

## 2012-01-03 ENCOUNTER — Ambulatory Visit: Payer: Medicare Other | Admitting: Medical

## 2012-01-04 ENCOUNTER — Telehealth: Payer: Self-pay | Admitting: Hematology & Oncology

## 2012-01-04 NOTE — Telephone Encounter (Signed)
Left pt message to call if she would like to reschedule missed 9-16 appointment

## 2012-04-14 ENCOUNTER — Telehealth: Payer: Self-pay | Admitting: Hematology & Oncology

## 2012-04-14 ENCOUNTER — Other Ambulatory Visit: Payer: Self-pay | Admitting: Medical

## 2012-04-14 NOTE — Telephone Encounter (Signed)
Pt cx 12-30 will call back to reschedule due to insurance issues

## 2012-04-17 ENCOUNTER — Ambulatory Visit: Payer: Medicare Other | Admitting: Medical

## 2012-04-17 ENCOUNTER — Other Ambulatory Visit: Payer: Medicare Other | Admitting: Lab

## 2012-10-06 NOTE — Telephone Encounter (Signed)
e

## 2012-11-01 ENCOUNTER — Encounter (HOSPITAL_COMMUNITY): Payer: Self-pay | Admitting: *Deleted

## 2012-11-01 ENCOUNTER — Emergency Department (HOSPITAL_COMMUNITY)
Admission: EM | Admit: 2012-11-01 | Discharge: 2012-11-01 | Disposition: A | Discharge status: Home / Self Care | Payer: Self-pay | Attending: Emergency Medicine | Admitting: Emergency Medicine

## 2012-11-01 ENCOUNTER — Emergency Department (HOSPITAL_COMMUNITY): Payer: Self-pay

## 2012-11-01 DIAGNOSIS — Z79899 Other long term (current) drug therapy: Secondary | ICD-10-CM | POA: Insufficient documentation

## 2012-11-01 DIAGNOSIS — I10 Essential (primary) hypertension: Secondary | ICD-10-CM | POA: Insufficient documentation

## 2012-11-01 DIAGNOSIS — Z8659 Personal history of other mental and behavioral disorders: Secondary | ICD-10-CM | POA: Insufficient documentation

## 2012-11-01 DIAGNOSIS — Z87898 Personal history of other specified conditions: Secondary | ICD-10-CM | POA: Insufficient documentation

## 2012-11-01 DIAGNOSIS — Z3202 Encounter for pregnancy test, result negative: Secondary | ICD-10-CM | POA: Insufficient documentation

## 2012-11-01 DIAGNOSIS — R109 Unspecified abdominal pain: Secondary | ICD-10-CM

## 2012-11-01 DIAGNOSIS — F17200 Nicotine dependence, unspecified, uncomplicated: Secondary | ICD-10-CM | POA: Insufficient documentation

## 2012-11-01 DIAGNOSIS — K625 Hemorrhage of anus and rectum: Secondary | ICD-10-CM | POA: Insufficient documentation

## 2012-11-01 DIAGNOSIS — K8020 Calculus of gallbladder without cholecystitis without obstruction: Secondary | ICD-10-CM | POA: Insufficient documentation

## 2012-11-01 DIAGNOSIS — R1011 Right upper quadrant pain: Secondary | ICD-10-CM | POA: Insufficient documentation

## 2012-11-01 DIAGNOSIS — R110 Nausea: Secondary | ICD-10-CM | POA: Insufficient documentation

## 2012-11-01 HISTORY — DX: Major depressive disorder, single episode, unspecified: F32.9

## 2012-11-01 HISTORY — DX: Depression, unspecified: F32.A

## 2012-11-01 HISTORY — DX: Essential (primary) hypertension: I10

## 2012-11-01 LAB — CBC WITH DIFFERENTIAL/PLATELET
Basophils Absolute: 0 10*3/uL (ref 0.0–0.1)
Lymphocytes Relative: 36 % (ref 12–46)
Neutro Abs: 3 10*3/uL (ref 1.7–7.7)
Neutrophils Relative %: 56 % (ref 43–77)
Platelets: 253 10*3/uL (ref 150–400)
RDW: 13 % (ref 11.5–15.5)
WBC: 5.4 10*3/uL (ref 4.0–10.5)

## 2012-11-01 LAB — URINALYSIS, ROUTINE W REFLEX MICROSCOPIC
Glucose, UA: NEGATIVE mg/dL
Hgb urine dipstick: NEGATIVE
Protein, ur: NEGATIVE mg/dL
pH: 7.5 (ref 5.0–8.0)

## 2012-11-01 LAB — COMPREHENSIVE METABOLIC PANEL
ALT: 6 U/L (ref 0–35)
AST: 21 U/L (ref 0–37)
CO2: 31 mEq/L (ref 19–32)
Chloride: 105 mEq/L (ref 96–112)
GFR calc non Af Amer: 85 mL/min — ABNORMAL LOW (ref >90)
Sodium: 142 mEq/L (ref 135–145)
Total Bilirubin: 0.3 mg/dL (ref 0.3–1.2)

## 2012-11-01 LAB — URINE MICROSCOPIC-ADD ON

## 2012-11-01 LAB — PREGNANCY, URINE: Preg Test, Ur: NEGATIVE

## 2012-11-01 MED ORDER — GI COCKTAIL ~~LOC~~
30.0000 mL | Freq: Once | ORAL | Status: AC
  Administered 2012-11-01: 30 mL via ORAL
  Filled 2012-11-01: qty 30

## 2012-11-01 MED ORDER — OMEPRAZOLE 20 MG PO CPDR: 20.0000 mg | DELAYED_RELEASE_CAPSULE | Freq: Every day | ORAL | Status: DC

## 2012-11-01 MED ORDER — SODIUM CHLORIDE 0.9 % IV BOLUS (SEPSIS)
1000.0000 mL | Freq: Once | INTRAVENOUS | Status: AC
  Administered 2012-11-01: 1000 mL via INTRAVENOUS

## 2012-11-01 MED ORDER — MORPHINE SULFATE 4 MG/ML IJ SOLN
4.0000 mg | Freq: Once | INTRAMUSCULAR | Status: AC
  Administered 2012-11-01: 4 mg via INTRAVENOUS
  Filled 2012-11-01: qty 1

## 2012-11-01 MED ORDER — ONDANSETRON HCL 4 MG/2ML IJ SOLN
4.0000 mg | Freq: Once | INTRAMUSCULAR | Status: AC
  Administered 2012-11-01: 4 mg via INTRAVENOUS
  Filled 2012-11-01: qty 2

## 2012-11-01 MED ORDER — FAMOTIDINE 20 MG PO TABS
40.0000 mg | ORAL_TABLET | Freq: Once | ORAL | Status: AC
  Administered 2012-11-01: 40 mg via ORAL
  Filled 2012-11-01: qty 2

## 2012-11-01 MED ORDER — HYDROCODONE-ACETAMINOPHEN 5-325 MG PO TABS: 1.0000 | ORAL_TABLET | ORAL | Status: DC | PRN

## 2012-11-01 NOTE — ED Notes (Signed)
Reports intermittent right side abd pain x 1 week, started having dark red blood in stools last night. Denies vomiting.

## 2012-11-01 NOTE — ED Provider Notes (Signed)
History    CSN: [id number] Arrival date & time 11/01/12  1353  First MD Initiated Contact with Patient 11/01/12 1427     Chief Complaint  Patient presents with  . Abdominal Pain  . Rectal Bleeding   (Consider location/radiation/quality/duration/timing/severity/associated sxs/prior Treatment) HPI  Patient is a 42 year old female past medical history significant for hypertension, depression presenting to the emergency department for one week of worsening intermittent right upper quadrant pain without radiation. Patient describes her pain sharp. She rates her pain 8/10. Patient has been having associated possible dark blood on the paper after bowel movements for the last 2 days. Patient does endorse that she has been undergoing a juicing cleanse and isn't entirely sure if the dark stools per day to the juicing. She has associated nausea. Denies any alleviating or aggravating factors. Patient denies fevers, chills, vomiting, diarrhea, constipation, chest pain, shortness of breath. Patient has had two C-sections and a hysterectomy.   Past Medical History  Diagnosis Date  . 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 Hypertension   . Depression    History reviewed. No pertinent past surgical history. History reviewed. No pertinent family history. History  Substance Use Topics  . Smoking status: Current Every Day Smoker    Types: Cigarettes  . Smokeless tobacco: Not on file  . Alcohol Use: No   OB History   Grav Para Term Preterm Abortions TAB SAB Ect Mult Living                 Review of Systems  Constitutional: Negative for fever and chills.  HENT: Negative for neck pain.   Eyes: Negative.   Respiratory: Negative for shortness of breath.   Cardiovascular: Negative for chest pain.  Gastrointestinal: Positive for nausea, abdominal pain and anal bleeding. Negative for vomiting, diarrhea and constipation.  Genitourinary: Negative.   Musculoskeletal: Negative for back  pain.  Skin: Negative.   Neurological: Negative.     Allergies  Penicillins  Home Medications   Current Outpatient Rx  Name  Route  Sig  Dispense  Refill  . albuterol (PROVENTIL HFA;VENTOLIN HFA) 108 (90 BASE) MCG/ACT inhaler   Inhalation   Inhale 2 puffs into the lungs every 6 (six) hours as needed for wheezing.         . Ascorbic Acid (VITAMIN C) 1000 MG tablet   Oral   Take 1,000 mg by mouth daily.         . hydrochlorothiazide (HYDRODIURIL) 25 MG tablet   Oral   Take 25 mg by mouth daily.         Marland Kitchen lisinopril (PRINIVIL,ZESTRIL) 40 MG tablet   Oral   Take 40 mg by mouth daily.         . metoprolol tartrate (LOPRESSOR) 25 MG tablet   Oral   Take 25 mg by mouth daily.          Marland Kitchen VITAMIN D, ERGOCALCIFEROL, PO   Oral   Take 1,000 mg by mouth every morning.          BP 148/93  Pulse 97  Temp(Src) 98.4 F (36.9 C) (Oral)  Resp 18  SpO2 99% Physical Exam  Constitutional: She is oriented to person, place, and time. She appears well-developed and well-nourished. No distress.  HENT:  Head: Normocephalic and atraumatic.  Mouth/Throat: Oropharynx is clear and moist.  Eyes: Conjunctivae are normal.  Neck: Neck supple.  Cardiovascular: Normal rate, regular rhythm and normal heart sounds.   Pulmonary/Chest: Effort normal and  breath sounds normal. No respiratory distress.  Abdominal: Soft. Bowel sounds are normal. There is tenderness in the right upper quadrant. There is no rigidity, no rebound, no guarding and no CVA tenderness.  Genitourinary: Rectum normal.  Musculoskeletal: She exhibits no edema.  Neurological: She is alert and oriented to person, place, and time.  Skin: Skin is warm and dry. She is not diaphoretic.    ED Course  Procedures (including critical care time) Labs Reviewed  COMPREHENSIVE METABOLIC PANEL - Abnormal; Notable for the following:    GFR calc non Af Amer 85 (*)    All other components within normal limits  CBC WITH  DIFFERENTIAL  LIPASE, BLOOD  URINALYSIS, ROUTINE W REFLEX MICROSCOPIC  OCCULT BLOOD X 1 CARD TO LAB, STOOL  PREGNANCY, URINE  OCCULT BLOOD, POC DEVICE   No results found. No diagnosis found.  MDM  Pt receiving IVF and pain medication. Labs reviewed. [place] ordered to R/O cholecystitis. Dispo pending [place] results. Signed out to [employer] PA-C to f/u US result. Discussed with Dr. Dan Humphreys is in agreement with the plan at this time.   Jeannetta Ellis, PA-C 11/01/12 1635

## 2012-11-01 NOTE — ED Provider Notes (Signed)
Care assumed from [place], [place].    Verneice L Miyoko Hashimi is a 42 y.o. female presents with RUQ abd pain x1 week with associated dark stools after 1 week of juice cleanse, but hemoccult negative here in the ED.  Rectal exam normal.  Labs unremarkable up to this point.  Plan: Pt is getting fluids and pain meds with pending [place] to r/o cholecystitis.  Pt has a PCP but no gastroenterologist.    7:36 PM Abdominal ultrasound shows gallbladder contracted and very echogenic with findings compatible with multiple stones. No evidence of cholecystitis or common bile duct distention indicating choledocholithiasis. Patient with decrease in pain, but mild gastric pain. We'll give GI cocktail and began on PPI.  Recommend followup with gastroenterology and central carina surgery for further evaluation of gallstones.  Patient is nontoxic, nonseptic appearing, in no apparent distress.  Patient's pain and other symptoms adequately managed in emergency department.  Labs, imaging and vitals reviewed.  Patient does not meet the SIRS or Sepsis criteria.  On repeat exam patient does not have a surgical abdomin and there are no peritoneal signs.  No indication of appendicitis, bowel obstruction, bowel perforation, cholecystitis, diverticulitis, ectopic pregnancy.  Patient discharged home with symptomatic treatment and given strict instructions for follow-up with their primary care physician, GI and in general surgery.  I have also discussed reasons to return immediately to the ER.  Patient expresses understanding and agrees with plan.     Dierdre Forth, PA-C 11/01/12 1939

## 2012-11-02 NOTE — ED Provider Notes (Signed)
Medical screening examination/treatment/procedure(s) were performed by non-physician practitioner and as supervising physician I was immediately available for consultation/collaboration.   Ashby Dawes, MD 11/02/12 0001

## 2012-11-02 NOTE — ED Provider Notes (Signed)
Medical screening examination/treatment/procedure(s) were performed by non-physician practitioner and as supervising physician I was immediately available for consultation/collaboration.   Audriana Aldama Ann Retia Cordle, MD 11/02/12 0001 

## 2014-08-02 ENCOUNTER — Emergency Department (HOSPITAL_COMMUNITY): Payer: Medicare Other

## 2014-08-02 ENCOUNTER — Inpatient Hospital Stay (HOSPITAL_COMMUNITY)
Admission: EM | Admit: 2014-08-02 | Discharge: 2014-08-03 | DRG: 392 | Disposition: A | Discharge status: Home / Self Care | Payer: Self-pay | Attending: Internal Medicine | Admitting: Internal Medicine

## 2014-08-02 ENCOUNTER — Emergency Department (HOSPITAL_COMMUNITY): Payer: Self-pay

## 2014-08-02 ENCOUNTER — Encounter (HOSPITAL_COMMUNITY): Payer: Self-pay | Admitting: Emergency Medicine

## 2014-08-02 DIAGNOSIS — R590 Localized enlarged lymph nodes: Secondary | ICD-10-CM | POA: Diagnosis present

## 2014-08-02 DIAGNOSIS — I10 Essential (primary) hypertension: Secondary | ICD-10-CM | POA: Diagnosis present

## 2014-08-02 DIAGNOSIS — K529 Noninfective gastroenteritis and colitis, unspecified: Principal | ICD-10-CM | POA: Diagnosis present

## 2014-08-02 DIAGNOSIS — J45909 Unspecified asthma, uncomplicated: Secondary | ICD-10-CM | POA: Diagnosis present

## 2014-08-02 DIAGNOSIS — R109 Unspecified abdominal pain: Secondary | ICD-10-CM | POA: Diagnosis present

## 2014-08-02 DIAGNOSIS — F329 Major depressive disorder, single episode, unspecified: Secondary | ICD-10-CM | POA: Diagnosis present

## 2014-08-02 DIAGNOSIS — Z880 Allergy status to penicillin: Secondary | ICD-10-CM

## 2014-08-02 DIAGNOSIS — Z8572 Personal history of non-Hodgkin lymphomas: Secondary | ICD-10-CM

## 2014-08-02 DIAGNOSIS — E860 Dehydration: Secondary | ICD-10-CM | POA: Diagnosis present

## 2014-08-02 DIAGNOSIS — F17210 Nicotine dependence, cigarettes, uncomplicated: Secondary | ICD-10-CM | POA: Diagnosis present

## 2014-08-02 DIAGNOSIS — E876 Hypokalemia: Secondary | ICD-10-CM | POA: Diagnosis present

## 2014-08-02 DIAGNOSIS — H0520 Unspecified exophthalmos: Secondary | ICD-10-CM | POA: Diagnosis present

## 2014-08-02 DIAGNOSIS — R9431 Abnormal electrocardiogram [ECG] [EKG]: Secondary | ICD-10-CM | POA: Diagnosis present

## 2014-08-02 DIAGNOSIS — R197 Diarrhea, unspecified: Secondary | ICD-10-CM | POA: Diagnosis present

## 2014-08-02 DIAGNOSIS — I959 Hypotension, unspecified: Secondary | ICD-10-CM | POA: Diagnosis present

## 2014-08-02 DIAGNOSIS — K8020 Calculus of gallbladder without cholecystitis without obstruction: Secondary | ICD-10-CM | POA: Diagnosis present

## 2014-08-02 DIAGNOSIS — N390 Urinary tract infection, site not specified: Secondary | ICD-10-CM | POA: Diagnosis present

## 2014-08-02 DIAGNOSIS — R591 Generalized enlarged lymph nodes: Secondary | ICD-10-CM | POA: Diagnosis present

## 2014-08-02 DIAGNOSIS — Z90710 Acquired absence of both cervix and uterus: Secondary | ICD-10-CM

## 2014-08-02 HISTORY — DX: Malignant (primary) neoplasm, unspecified: C80.1

## 2014-08-02 LAB — URINALYSIS, ROUTINE W REFLEX MICROSCOPIC
Glucose, UA: NEGATIVE mg/dL
Hgb urine dipstick: NEGATIVE
Ketones, ur: NEGATIVE mg/dL
LEUKOCYTES UA: NEGATIVE
Nitrite: NEGATIVE
PROTEIN: NEGATIVE mg/dL
Specific Gravity, Urine: 1.013 (ref 1.005–1.030)
Urobilinogen, UA: 1 mg/dL (ref 0.0–1.0)
pH: 6.5 (ref 5.0–8.0)

## 2014-08-02 LAB — CBC WITH DIFFERENTIAL/PLATELET
BASOS PCT: 0 % (ref 0–1)
Basophils Absolute: 0 10*3/uL (ref 0.0–0.1)
Eosinophils Absolute: 0.1 10*3/uL (ref 0.0–0.7)
Eosinophils Relative: 1 % (ref 0–5)
HCT: 42 % (ref 36.0–46.0)
HEMOGLOBIN: 14.4 g/dL (ref 12.0–15.0)
Lymphocytes Relative: 35 % (ref 12–46)
Lymphs Abs: 2.6 10*3/uL (ref 0.7–4.0)
MCH: 33.2 pg (ref 26.0–34.0)
MCHC: 34.3 g/dL (ref 30.0–36.0)
MCV: 96.8 fL (ref 78.0–100.0)
Monocytes Absolute: 0.2 10*3/uL (ref 0.1–1.0)
Monocytes Relative: 3 % (ref 3–12)
Neutro Abs: 4.5 10*3/uL (ref 1.7–7.7)
Neutrophils Relative %: 61 % (ref 43–77)
Platelets: 292 10*3/uL (ref 150–400)
RBC: 4.34 MIL/uL (ref 3.87–5.11)
RDW: 12.7 % (ref 11.5–15.5)
WBC: 7.4 10*3/uL (ref 4.0–10.5)

## 2014-08-02 LAB — COMPREHENSIVE METABOLIC PANEL
ALBUMIN: 4.2 g/dL (ref 3.5–5.2)
ALT: 14 U/L (ref 0–35)
AST: 20 U/L (ref 0–37)
Alkaline Phosphatase: 75 U/L (ref 39–117)
Anion gap: 11 (ref 5–15)
BILIRUBIN TOTAL: 0.3 mg/dL (ref 0.3–1.2)
BUN: 14 mg/dL (ref 6–23)
CHLORIDE: 102 mmol/L (ref 96–112)
CO2: 27 mmol/L (ref 19–32)
CREATININE: 1.09 mg/dL (ref 0.50–1.10)
Calcium: 9.3 mg/dL (ref 8.4–10.5)
GFR calc Af Amer: 72 mL/min — ABNORMAL LOW (ref >90)
GFR, EST NON AFRICAN AMERICAN: 62 mL/min — AB (ref >90)
Glucose, Bld: 169 mg/dL — ABNORMAL HIGH (ref 70–99)
Potassium: 3 mmol/L — ABNORMAL LOW (ref 3.5–5.1)
Sodium: 140 mmol/L (ref 135–145)
Total Protein: 6.8 g/dL (ref 6.0–8.3)

## 2014-08-02 LAB — I-STAT CG4 LACTIC ACID, ED: LACTIC ACID, VENOUS: 1.79 mmol/L (ref 0.5–2.0)

## 2014-08-02 LAB — LIPASE, BLOOD: LIPASE: 17 U/L (ref 11–59)

## 2014-08-02 LAB — I-STAT TROPONIN, ED: Troponin i, poc: 0.01 ng/mL (ref 0.00–0.08)

## 2014-08-02 LAB — TYPE AND SCREEN
ABO/RH(D): O NEG
Antibody Screen: NEGATIVE

## 2014-08-02 LAB — PHOSPHORUS: PHOSPHORUS: 3.6 mg/dL (ref 2.3–4.6)

## 2014-08-02 LAB — MAGNESIUM: Magnesium: 2.1 mg/dL (ref 1.5–2.5)

## 2014-08-02 LAB — POC URINE PREG, ED: Preg Test, Ur: NEGATIVE

## 2014-08-02 MED ORDER — FENTANYL CITRATE (PF) 100 MCG/2ML IJ SOLN
50.0000 ug | Freq: Once | INTRAMUSCULAR | Status: AC
  Administered 2014-08-02: 50 ug via INTRAVENOUS
  Filled 2014-08-02: qty 2

## 2014-08-02 MED ORDER — SODIUM CHLORIDE 0.9 % IV BOLUS (SEPSIS)
1000.0000 mL | Freq: Once | INTRAVENOUS | Status: AC
  Administered 2014-08-02: 1000 mL via INTRAVENOUS

## 2014-08-02 MED ORDER — IOHEXOL 300 MG/ML  SOLN
100.0000 mL | Freq: Once | INTRAMUSCULAR | Status: AC | PRN
  Administered 2014-08-02: 100 mL via INTRAVENOUS

## 2014-08-02 MED ORDER — ACETAMINOPHEN 650 MG RE SUPP: 650.0000 mg | Freq: Four times a day (QID) | RECTAL | Status: DC | PRN

## 2014-08-02 MED ORDER — SODIUM CHLORIDE 0.9 % IV SOLN
INTRAVENOUS | Status: AC
  Administered 2014-08-02 – 2014-08-03 (×2): via INTRAVENOUS

## 2014-08-02 MED ORDER — SODIUM CHLORIDE 0.9 % IJ SOLN
3.0000 mL | Freq: Two times a day (BID) | INTRAMUSCULAR | Status: DC
  Administered 2014-08-03: 3 mL via INTRAVENOUS

## 2014-08-02 MED ORDER — ALBUTEROL SULFATE HFA 108 (90 BASE) MCG/ACT IN AERS: 2.0000 | INHALATION_SPRAY | Freq: Four times a day (QID) | RESPIRATORY_TRACT | Status: DC | PRN

## 2014-08-02 MED ORDER — ACETAMINOPHEN 325 MG PO TABS: 650.0000 mg | ORAL_TABLET | Freq: Four times a day (QID) | ORAL | Status: DC | PRN

## 2014-08-02 MED ORDER — POTASSIUM CHLORIDE 10 MEQ/100ML IV SOLN
10.0000 meq | INTRAVENOUS | Status: AC
Start: 2014-08-02 — End: 2014-08-03
  Administered 2014-08-02 – 2014-08-03 (×4): 10 meq via INTRAVENOUS
  Filled 2014-08-02 (×4): qty 100

## 2014-08-02 MED ORDER — ENOXAPARIN SODIUM 40 MG/0.4ML ~~LOC~~ SOLN
40.0000 mg | Freq: Every day | SUBCUTANEOUS | Status: DC
  Administered 2014-08-02: 40 mg via SUBCUTANEOUS
  Filled 2014-08-02 (×2): qty 0.4

## 2014-08-02 MED ORDER — ALBUTEROL SULFATE (2.5 MG/3ML) 0.083% IN NEBU: 2.5000 mg | INHALATION_SOLUTION | Freq: Four times a day (QID) | RESPIRATORY_TRACT | Status: DC | PRN

## 2014-08-02 MED ORDER — HYDROCODONE-ACETAMINOPHEN 5-325 MG PO TABS
1.0000 | ORAL_TABLET | ORAL | Status: DC | PRN
  Administered 2014-08-02: 2 via ORAL
  Administered 2014-08-03: 1 via ORAL
  Filled 2014-08-02: qty 2
  Filled 2014-08-02: qty 1

## 2014-08-02 MED ORDER — ONDANSETRON HCL 4 MG/2ML IJ SOLN
4.0000 mg | Freq: Once | INTRAMUSCULAR | Status: AC
  Administered 2014-08-02: 4 mg via INTRAVENOUS
  Filled 2014-08-02: qty 2

## 2014-08-02 MED ORDER — POTASSIUM CHLORIDE CRYS ER 20 MEQ PO TBCR
40.0000 meq | EXTENDED_RELEASE_TABLET | Freq: Once | ORAL | Status: AC
  Administered 2014-08-02: 40 meq via ORAL
  Filled 2014-08-02: qty 2

## 2014-08-02 NOTE — ED Notes (Signed)
Patient transported to CT 

## 2014-08-02 NOTE — ED Notes (Addendum)
Labs will be drawn with IV start per RN.

## 2014-08-02 NOTE — ED Notes (Signed)
Bed: WA03 Expected date:  Expected time:  Means of arrival:  Comments: EMS- LUQ pain/nausea

## 2014-08-02 NOTE — ED Notes (Signed)
Eating at [employer], finished her meal, then began having left upper abd pain. Hx of gallstones. Complains of nausea, no vomiting.

## 2014-08-02 NOTE — ED Provider Notes (Signed)
CSN: [id number]     Arrival date & time 08/02/14  1619 History   First MD Initiated Contact with Patient 08/02/14 1641     Chief Complaint  Patient presents with  . Abdominal Pain  . Nausea  . Diarrhea     (Consider location/radiation/quality/duration/timing/severity/associated sxs/prior Treatment) Patient is a 44 y.o. female presenting with abdominal pain and diarrhea. The history is provided by the patient.  Abdominal Pain Pain location:  LUQ and epigastric Pain quality: sharp   Pain radiates to:  Epigastric region Pain severity:  Severe Onset quality:  Sudden Duration:  1 hour Timing:  Constant Progression:  Unchanged Chronicity:  New Context: not diet changes, not eating, not previous surgeries, not sick contacts and not suspicious food intake   Relieved by:  Nothing Worsened by:  Nothing tried Associated symptoms: diarrhea and nausea   Associated symptoms: no chest pain, no fever, no shortness of breath and no vomiting   Diarrhea Associated symptoms: abdominal pain (upon arrival)   Associated symptoms: no fever and no vomiting     Past Medical History  Diagnosis Date  . 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 Hypertension   . Depression   . Cancer    Past Surgical History  Procedure Laterality Date  . Abdominal hysterectomy    . Cesarean section    . Bone biopsy     No family history on file. History  Substance Use Topics  . Smoking status: Current Every Day Smoker    Types: Cigarettes  . Smokeless tobacco: Not on file  . Alcohol Use: No   OB History    No data available     Review of Systems  Constitutional: Negative for fever.  Respiratory: Negative for shortness of breath.   Cardiovascular: Negative for chest pain and leg swelling.  Gastrointestinal: Positive for nausea, abdominal pain (upon arrival) and diarrhea. Negative for vomiting.  All other systems reviewed and are negative.     Allergies  Penicillins  Home Medications    Prior to Admission medications   Medication Sig Start Date End Date Taking? Authorizing Provider  albuterol (PROVENTIL HFA;VENTOLIN HFA) 108 (90 BASE) MCG/ACT inhaler Inhale 2 puffs into the lungs every 6 (six) hours as needed for wheezing.    Historical Provider, MD  Ascorbic Acid (VITAMIN C) 1000 MG tablet Take 1,000 mg by mouth daily.    Historical Provider, MD  hydrochlorothiazide (HYDRODIURIL) 25 MG tablet Take 25 mg by mouth daily.    Historical Provider, MD  HYDROcodone-acetaminophen (NORCO/VICODIN) 5-325 MG per tablet Take 1 tablet by mouth every 4 (four) hours as needed for pain. 11/01/12   Hannah Muthersbaugh, PA-C  lisinopril (PRINIVIL,ZESTRIL) 40 MG tablet Take 40 mg by mouth daily.    Historical Provider, MD  metoprolol tartrate (LOPRESSOR) 25 MG tablet Take 25 mg by mouth daily.     Historical Provider, MD  omeprazole (PRILOSEC) 20 MG capsule Take 1 capsule (20 mg total) by mouth daily. 11/01/12   Hannah Muthersbaugh, PA-C  VITAMIN D, ERGOCALCIFEROL, PO Take 1,000 mg by mouth every morning.    Historical Provider, MD   BP 89/49 mmHg  Pulse 68  Temp(Src) 97.4 F (36.3 C) (Oral)  Resp 18  SpO2 99% Physical Exam  Constitutional: She is oriented to person, place, and time. She appears well-developed and well-nourished. No distress.  HENT:  Head: Normocephalic and atraumatic.  Mouth/Throat: Oropharynx is clear and moist.  Eyes: EOM are normal. Pupils are equal, round, and reactive to light.  Neck: Normal range of motion. Neck supple.  Cardiovascular: Normal rate and regular rhythm.  Exam reveals no friction rub.   No murmur heard. Pulmonary/Chest: Effort normal and breath sounds normal. No respiratory distress. She has no wheezes. She has no rales.  Abdominal: Soft. She exhibits no distension. There is tenderness (epigastric, LUQ. No RUQ pain). There is no rebound.  Musculoskeletal: Normal range of motion. She exhibits no edema.  Neurological: She is alert and oriented to  person, place, and time.  Skin: No rash noted. She is not diaphoretic.  Nursing note and vitals reviewed.   ED Course  Procedures (including critical care time) Labs Review Labs Reviewed  CBC WITH DIFFERENTIAL/PLATELET  COMPREHENSIVE METABOLIC PANEL  LIPASE, BLOOD  URINALYSIS, ROUTINE W REFLEX MICROSCOPIC  I-STAT TROPOININ, ED  I-STAT CG4 LACTIC ACID, ED  TYPE AND SCREEN    Imaging Review No results found.   EKG Interpretation None      EMERGENCY DEPARTMENT [place] ABD/AORTA EXAM Study: Limited Ultrasound of the Abdominal Aorta.  INDICATIONS:Hypotension and Abdominal pain Indication: Multiple views of the abdominal aorta are obtained from the diaphragmatic hiatus to the aortic bifurcation in transverse and sagittal planes with a multi- Frequency probe.  PERFORMED BY: Myself  IMAGES ARCHIVED?: Yes  FINDINGS: Free fluid absent  LIMITATIONS:  Body habitus  INTERPRETATION:  No abdominal aortic aneurysm  EMERGENCY DEPARTMENT [place] FAST EXAM  INDICATIONS:Hypotension  PERFORMED BY: Myself  IMAGES ARCHIVED?: Yes  FINDINGS: All views negative  LIMITATIONS:  Body habitus  INTERPRETATION:  No abdominal free fluid  COMMENT:  Difficult secondary to body habitus  Angiocath insertion Performed by: Osvaldo Shipper  Consent: Verbal consent obtained. Risks and benefits: risks, benefits and alternatives were discussed Time out: Immediately prior to procedure a "time out" was called to verify the correct patient, procedure, equipment, support staff and site/side marked as required.  Preparation: Patient was prepped and draped in the usual sterile fashion.  Vein Location: R AC  Yes Ultrasound Guided  Gauge: 20  Picture archived  Normal blood return and flush without difficulty Patient tolerance: Patient tolerated the procedure well with no immediate complications.    CRITICAL CARE Performed by: Osvaldo Shipper   Total critical care time: 30  minutes  Critical care time was exclusive of separately billable procedures and treating other patients.  Critical care was necessary to treat or prevent imminent or life-threatening deterioration.  Critical care was time spent personally by me on the following activities: development of treatment plan with patient and/or surrogate as well as nursing, discussions with consultants, evaluation of patient's response to treatment, examination of patient, obtaining history from patient or surrogate, ordering and performing treatments and interventions, ordering and review of laboratory studies, ordering and review of radiographic studies, pulse oximetry and re-evaluation of patient's condition.  MDM   Final diagnoses:  Abdominal pain  Hypotension    63F with hx of HTN, lymphoma presents with acute onset nausea, LUQ, epigastric pain while eating. Sharp, radiated across entire upper abdomen. No vomiting. Patient had large, voluminous bowel movement while here, no hematochezia. On my initial exam, hypotensive, pressures ranging from 65-03 systolic. Bedside FAST [place] negative, aortic US without evidence of large aneurysm. No hx of aortic aneurysm noted on prior CTs. No hx of gastric ulcers. Was originally hypertensive with EMS. Hypotension may be result of large vagal stimulation from bowel movement, however with multiple repeated low BPs, will scan her abdomen.  CT normal. No acute cause of her hypotension. Patient's BP  improving after fluids. Admitted.   Evelina Bucy, MD 08/03/14 [phone]

## 2014-08-02 NOTE — H&P (Addendum)
PCP:  No primary care provider on file.  Oncology [facility]  Chief Complaint: Abdominal pain and diarrhea associated hypertension  HPI: Angel Dunlap is a 44 y.o. female   has a past medical history of Personal history of malignant lymphoma (09/01/2011); Hypertension; Depression; and Cancer.   Asian reports she was eating out at a restaurant (eating [employer]ethro Bolus and fries) when she developed left upper abdominal pain and some nausea. Her pain was severe and she presented to emergency department. On arrival to emergency room she was noted to be hypotensive down to 80/49. She was given aggressive IV fluid resuscitation patient had a large episode of diarrhea. After receiving 3 L of IV normal saline her blood pressure has improved and currently back up to 114/74. CT scan of abdomen was obtained showed gallbladder 4 small stones but no CT evidence of cholecystitis or obstruction. Incidentally was a finding of right inguinal femoral lymph node measuring 2.8 cm which was worrisome given prior history of lymphoma.  Of note patient has history of stage IV follicular small cell lymphoma currently in remission followed by Dr. Marin Olp Hospitalist was called for admission for dehydration transient hypotension  Review of Systems:    Pertinent positives include: abdominal pain, nausea, diarrhea,  Constitutional:  No weight loss, night sweats, Fevers, chills, fatigue, weight loss  HEENT:  No headaches, Difficulty swallowing,Tooth/dental problems,Sore throat,  No sneezing, itching, ear ache, nasal congestion, post nasal drip,  Cardio-vascular:  No chest pain, Orthopnea, PND, anasarca, dizziness, palpitations.no Bilateral lower extremity swelling  GI:  No heartburn, indigestion, vomiting,  change in bowel habits, loss of appetite, melena, blood in stool, hematemesis Resp:  no shortness of breath at rest. No dyspnea on exertion, No excess mucus, no productive cough, No non-productive cough, No  coughing up of blood.No change in color of mucus.No wheezing. Skin:  no rash or lesions. No jaundice GU:  no dysuria, change in color of urine, no urgency or frequency. No straining to urinate.  No flank pain.  Musculoskeletal:  No joint pain or no joint swelling. No decreased range of motion. No back pain.  Psych:  No change in mood or affect. No depression or anxiety. No memory loss.  Neuro: no localizing neurological complaints, no tingling, no weakness, no double vision, no gait abnormality, no slurred speech, no confusion  Otherwise ROS are negative except for above, 10 systems were reviewed  Past Medical History: Past Medical History  Diagnosis Date  . 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 Hypertension   . Depression   . Cancer    Past Surgical History  Procedure Laterality Date  . Abdominal hysterectomy    . Cesarean section    . Bone biopsy      x 2  . Port-a-cath removal    . Portacath placement       Medications: Prior to Admission medications   Medication Sig Start Date End Date Taking? Authorizing Provider  albuterol (PROVENTIL HFA;VENTOLIN HFA) 108 (90 BASE) MCG/ACT inhaler Inhale 2 puffs into the lungs every 6 (six) hours as needed for wheezing.   Yes Historical Provider, MD  amLODipine (NORVASC) 10 MG tablet Take 10 mg by mouth daily.   Yes Historical Provider, MD  hydrochlorothiazide (HYDRODIURIL) 25 MG tablet Take 25 mg by mouth daily.   Yes Historical Provider, MD  lisinopril (PRINIVIL,ZESTRIL) 40 MG tablet Take 40 mg by mouth daily.   Yes Historical Provider, MD  phentermine 37.5 MG capsule Take 37.5 mg by mouth  every morning.   Yes Historical Provider, MD  HYDROcodone-acetaminophen (NORCO/VICODIN) 5-325 MG per tablet Take 1 tablet by mouth every 4 (four) hours as needed for pain. 11/01/12   Hannah Muthersbaugh, PA-C  omeprazole (PRILOSEC) 20 MG capsule Take 1 capsule (20 mg total) by mouth daily. 11/01/12   Hannah Muthersbaugh, PA-C     Allergies:   Allergies  Allergen Reactions  . Penicillins Swelling and Rash    Social History:  Ambulatory  independently   Lives at home with partner     reports that she has been smoking Cigarettes.  She has never used smokeless tobacco. She reports that she does not drink alcohol or use illicit drugs.    Family History: family history includes Cancer in her mother; Hypertension in her father and mother; Stroke in her father. There is no history of Diabetes or CAD.    Physical Exam: Patient Vitals for the past 24 hrs:  BP Temp Temp src Pulse Resp SpO2  08/02/14 2109 114/74 mmHg - - 98 18 100 %  08/02/14 2100 112/76 mmHg - - 99 - 100 %  08/02/14 2045 119/72 mmHg - - 85 - 100 %  08/02/14 2030 107/76 mmHg - - 91 17 100 %  08/02/14 2015 109/72 mmHg - - 92 20 99 %  08/02/14 2000 123/77 mmHg - - 98 24 96 %  08/02/14 1935 103/73 mmHg - - 101 18 99 %  08/02/14 1930 103/73 mmHg - - 107 22 99 %  08/02/14 1845 99/73 mmHg - - - 25 -  08/02/14 1815 (!) 87/44 mmHg - - 68 16 (!) 78 %  08/02/14 1800 (!) 85/59 mmHg - - 94 25 100 %  08/02/14 1745 102/63 mmHg - - 73 21 99 %  08/02/14 1733 103/67 mmHg - - 78 18 98 %  08/02/14 1650 (!) 89/49 mmHg 97.4 F (36.3 C) Oral 68 18 99 %    1. General:  in No Acute distress 2. Psychological: Alert and  Oriented 3. Head/ENT:    Dry Mucous Membranes                          Head Non traumatic, neck supple                          Normal   Dentition                            Proptosis noted 4. SKIN:  decreased Skin turgor,  Skin clean Dry and intact no rash 5. Heart: Regular rate and rhythm no Murmur, Rub or gallop 6. Lungs: Clear to auscultation bilaterally, no wheezes or crackles   7. Abdomen:  Mild tenderness left upper quadrant, slightly distended 8. Lower extremities: no clubbing, cyanosis, or edema 9. Neurologically Grossly intact, moving all 4 extremities equally 10. MSK: Normal range of motion  body mass index is unknown because  there is no weight on file.   Labs on Admission:   Results for orders placed or performed during the hospital encounter of 08/02/14 (from the past 24 hour(s))  CBC with Differential     Status: None   Collection Time: 08/02/14  5:37 PM  Result Value Ref Range   WBC 7.4 4.0 - 10.5 K/uL   RBC 4.34 3.87 - 5.11 MIL/uL   Hemoglobin 14.4 12.0 - 15.0 g/dL   HCT 42.0 36.0 -  46.0 %   MCV 96.8 78.0 - 100.0 fL   MCH 33.2 26.0 - 34.0 pg   MCHC 34.3 30.0 - 36.0 g/dL   RDW 12.7 11.5 - 15.5 %   Platelets 292 150 - 400 K/uL   Neutrophils Relative % 61 43 - 77 %   Neutro Abs 4.5 1.7 - 7.7 K/uL   Lymphocytes Relative 35 12 - 46 %   Lymphs Abs 2.6 0.7 - 4.0 K/uL   Monocytes Relative 3 3 - 12 %   Monocytes Absolute 0.2 0.1 - 1.0 K/uL   Eosinophils Relative 1 0 - 5 %   Eosinophils Absolute 0.1 0.0 - 0.7 K/uL   Basophils Relative 0 0 - 1 %   Basophils Absolute 0.0 0.0 - 0.1 K/uL  Comprehensive metabolic panel     Status: Abnormal   Collection Time: 08/02/14  5:37 PM  Result Value Ref Range   Sodium 140 135 - 145 mmol/L   Potassium 3.0 (L) 3.5 - 5.1 mmol/L   Chloride 102 96 - 112 mmol/L   CO2 27 19 - 32 mmol/L   Glucose, Bld 169 (H) 70 - 99 mg/dL   BUN 14 6 - 23 mg/dL   Creatinine, Ser 1.09 0.50 - 1.10 mg/dL   Calcium 9.3 8.4 - 10.5 mg/dL   Total Protein 6.8 6.0 - 8.3 g/dL   Albumin 4.2 3.5 - 5.2 g/dL   AST 20 0 - 37 U/L   ALT 14 0 - 35 U/L   Alkaline Phosphatase 75 39 - 117 U/L   Total Bilirubin 0.3 0.3 - 1.2 mg/dL   GFR calc non Af Amer 62 (L) >90 mL/min   GFR calc Af Amer 72 (L) >90 mL/min   Anion gap 11 5 - 15  Lipase, blood     Status: None   Collection Time: 08/02/14  5:37 PM  Result Value Ref Range   Lipase 17 11 - 59 U/L  I-stat troponin, ED (only if pt is 42 y.o. or older & pain is above umbilicus) - do not order at [facility]     Status: None   Collection Time: 08/02/14  5:37 PM  Result Value Ref Range   Troponin i, poc 0.01 0.00 - 0.08 ng/mL   Comment 3          I-Stat CG4  Lactic Acid, ED     Status: None   Collection Time: 08/02/14  5:39 PM  Result Value Ref Range   Lactic Acid, Venous 1.79 0.5 - 2.0 mmol/L  Urinalysis, Routine w reflex microscopic     Status: Abnormal   Collection Time: 08/02/14  6:41 PM  Result Value Ref Range   Color, Urine AMBER (A) YELLOW   APPearance CLEAR CLEAR   Specific Gravity, Urine 1.013 1.005 - 1.030   pH 6.5 5.0 - 8.0   Glucose, UA NEGATIVE NEGATIVE mg/dL   Hgb urine dipstick NEGATIVE NEGATIVE   Bilirubin Urine SMALL (A) NEGATIVE   Ketones, ur NEGATIVE NEGATIVE mg/dL   Protein, ur NEGATIVE NEGATIVE mg/dL   Urobilinogen, UA 1.0 0.0 - 1.0 mg/dL   Nitrite NEGATIVE NEGATIVE   Leukocytes, UA NEGATIVE NEGATIVE  POC urine preg, ED (not at [facility])     Status: None   Collection Time: 08/02/14  6:46 PM  Result Value Ref Range   Preg Test, Ur NEGATIVE NEGATIVE  Type and screen     Status: None   Collection Time: 08/02/14  6:50 PM  Result Value Ref Range  ABO/RH(D) O NEG    Antibody Screen NEG    Sample Expiration 08/05/2014     UA UA showed no evidence of UTI  No results found for: HGBA1C  CrCl cannot be calculated (Unknown ideal weight.).  BNP (last 3 results) No results for input(s): PROBNP in the last 8760 hours.  Other results:  I have pearsonaly reviewed this: ECG REPORT  Rate: 70  Rhythm: Normal sinus rhythm ST&T Change: No ischemic changes QTC 487  There were no vitals filed for this visit.   Cultures:    Component Value Date/Time   SDES BLOOD LEFT ARM 06/06/2008 1445   SPECREQUEST BOTTLES DRAWN AEROBIC AND ANAEROBIC 10CC EACH 06/06/2008 1445   CULT NO GROWTH 5 DAYS 06/06/2008 1445   REPTSTATUS 06/12/2008 FINAL 06/06/2008 1445     Radiological Exams on Admission: Dg Chest 2 View  08/02/2014   CLINICAL DATA:  Patient is having pain on the left side of the chest that started today. Patient denies any SOB at this time. Patient states within the past few hours she has developed a cough. Patient has a  hx of HTN and asthma.  EXAM: CHEST  2 VIEW  COMPARISON:  None.  FINDINGS: The heart size and mediastinal contours are within normal limits. Both lungs are clear. No pleural effusion or pneumothorax. The visualized skeletal structures are unremarkable.  IMPRESSION: No active cardiopulmonary disease.   Electronically Signed   By: Lajean Manes M.D.   On: 08/02/2014 19:59   Ct Abdomen Pelvis W Contrast  08/02/2014   CLINICAL DATA:  Left upper quadrant pain. Nausea. History of gallstones. History of lymphoma.  EXAM: CT ABDOMEN AND PELVIS WITH CONTRAST  TECHNIQUE: Multidetector CT imaging of the abdomen and pelvis was performed using the standard protocol following bolus administration of intravenous contrast.  CONTRAST:  176mL OMNIPAQUE IOHEXOL 300 MG/ML  SOLN  COMPARISON:  06/06/2008  FINDINGS: Lung bases are clear. No pleural or pericardial fluid. The liver appears normal without focal lesions or biliary ductal dilatation. Multiple small gallstones fill the gallbladder. No CT evidence of gallbladder inflammation. No visible ductal stone. The spleen is normal in size. No focal lesions. The pancreas is normal. The adrenal glands are normal. The kidneys are normal. No cyst, mass, stone or hydronephrosis. The aorta and IVC are normal. No retroperitoneal mass or adenopathy. No bowel pathology is seen. There has been previous hysterectomy. No acute bone finding. There is a right inguinal/femoral lymph node measuring 2.8 cm in diameter of some concern. However, as an isolated finding, this could be reactive. There are a few small nodes in the right iliac chain, probably not significant.  IMPRESSION: No acute finding by CT.  Gallbladder full of small stones. No CT evidence of cholecystitis or obstruction.  No evidence of acute bowel pathology.  Right inguinal/ femoral lymph node measuring up to 2.8 cm in diameter. This is the only lymphatic system finding of concern in this patient with a previous history of lymphoma.    Electronically Signed   By: Nelson Chimes M.D.   On: 08/02/2014 19:23    Chart has been reviewed  Assessment/Plan 44 yo F with prior hx of HTN presents with episode of abdominal pain, diarrhea and hypotension.   Present on Admission:  . Abdominal pain - etiology unclear currently resolved possible mild list enteritis. No right upper quadrant tenderness to suggest biliary disease. CT scan showing no acute findings. We'll continue to monitor. Lactic acid within normal limits  . Hypokalemia will  replace and check magnesium and phosphate level patient is on diet using phentermine  . Diarrhea isolated episode if repeat obtain  stool cultures  . Hypotension -transient most likely secondary to dehydration resolved with IV fluids will continue to follow   . Asthma Cuyler currently stable albuterol as needed  . Prolonged QT-  Interval Mildly prolonged given tachycardia will hold phentermine  and/or any QTC prolonging medications   proptosis  - in a setting of the cardio check TSH and T4 and T3 levels   hypertension hold blood pressure medications given hypotension. Can try to restart as tolerated on blood pressure stabilizes I believe the patient should not be on some Tenormin given history of hypertension which has been severe. CT scan incidental finding of small lymph node should be followed up as an outpatient  Prophylaxis:  Lovenox, Protonix  CODE STATUS:  FULL CODE   Other plan as per orders.  I have spent a total of 55 min on this admission  Brityn Mastrogiovanni 08/02/2014, 9:38 PM  [facility]  Pager [phone]   after 2 AM please page floor coverage PA If 7AM-7PM, please contact the day team taking care of the patient  [email or web address]  Password [email or web address]

## 2014-08-03 DIAGNOSIS — I951 Orthostatic hypotension: Secondary | ICD-10-CM

## 2014-08-03 DIAGNOSIS — R599 Enlarged lymph nodes, unspecified: Secondary | ICD-10-CM

## 2014-08-03 LAB — COMPREHENSIVE METABOLIC PANEL
ALK PHOS: 57 U/L (ref 39–117)
ALT: 10 U/L (ref 0–35)
AST: 15 U/L (ref 0–37)
Albumin: 3.2 g/dL — ABNORMAL LOW (ref 3.5–5.2)
Anion gap: 4 — ABNORMAL LOW (ref 5–15)
BILIRUBIN TOTAL: 0.3 mg/dL (ref 0.3–1.2)
BUN: 10 mg/dL (ref 6–23)
CALCIUM: 7.7 mg/dL — AB (ref 8.4–10.5)
CO2: 25 mmol/L (ref 19–32)
CREATININE: 0.72 mg/dL (ref 0.50–1.10)
Chloride: 111 mmol/L (ref 96–112)
GFR calc non Af Amer: >90 mL/min (ref >90)
GLUCOSE: 88 mg/dL (ref 70–99)
Potassium: 4.2 mmol/L (ref 3.5–5.1)
Sodium: 140 mmol/L (ref 135–145)
Total Protein: 5.1 g/dL — ABNORMAL LOW (ref 6.0–8.3)

## 2014-08-03 LAB — CBC
HEMATOCRIT: 37 % (ref 36.0–46.0)
HEMOGLOBIN: 12.4 g/dL (ref 12.0–15.0)
MCH: 32.9 pg (ref 26.0–34.0)
MCHC: 33.5 g/dL (ref 30.0–36.0)
MCV: 98.1 fL (ref 78.0–100.0)
PLATELETS: 224 10*3/uL (ref 150–400)
RBC: 3.77 MIL/uL — AB (ref 3.87–5.11)
RDW: 13.1 % (ref 11.5–15.5)
WBC: 7.3 10*3/uL (ref 4.0–10.5)

## 2014-08-03 LAB — T4, FREE: Free T4: 1.2 ng/dL (ref 0.80–1.80)

## 2014-08-03 LAB — MAGNESIUM: Magnesium: 2 mg/dL (ref 1.5–2.5)

## 2014-08-03 LAB — TSH
TSH: 0.252 u[IU]/mL — ABNORMAL LOW (ref 0.350–4.500)
TSH: 0.662 u[IU]/mL (ref 0.350–4.500)

## 2014-08-03 LAB — PHOSPHORUS: Phosphorus: 3.2 mg/dL (ref 2.3–4.6)

## 2014-08-03 MED ORDER — HYDROCHLOROTHIAZIDE 25 MG PO TABS: 25.0000 mg | ORAL_TABLET | Freq: Every day | ORAL | Status: AC

## 2014-08-03 MED ORDER — LISINOPRIL 40 MG PO TABS: 40.0000 mg | ORAL_TABLET | Freq: Every day | ORAL | Status: AC

## 2014-08-03 MED ORDER — AMLODIPINE BESYLATE 10 MG PO TABS: 10.0000 mg | ORAL_TABLET | Freq: Every day | ORAL | Status: AC

## 2014-08-03 NOTE — Progress Notes (Signed)
Utilization Review Completed.Angel Dunlap T4/16/2016  

## 2014-08-03 NOTE — Progress Notes (Signed)
Reviewed discharge instructions and medications with patient. Patient verbalizes understanding. No concerns/compliants, verbalizes understanding to schedule follow up appointments.  Barbee Shropshire. Brigitte Pulse, RN

## 2014-08-03 NOTE — Discharge Instructions (Signed)
Follow with Primary MD in 7 days   Get CBC, CMP, 2 view Chest X ray checked  by Primary MD next visit.    Activity: As tolerated with Full fall precautions use walker/cane & assistance as needed   Disposition Home    Diet: Heart Healthy , please avoid spicy and fat rich food., with feeding assistance and aspiration precautions.  For Heart failure patients - Check your Weight same time everyday, if you gain over 2 pounds, or you develop in leg swelling, experience more shortness of breath or chest pain, call your Primary MD immediately. Follow Cardiac Low Salt Diet and 1.5 lit/day fluid restriction.   On your next visit with your primary care physician please Get Medicines reviewed and adjusted.   Please request your Prim.MD to go over all Hospital Tests and Procedure/Radiological results at the follow up, please get all Hospital records sent to your Prim MD by signing hospital release before you go home.   If you experience worsening of your admission symptoms, develop shortness of breath, life threatening emergency, suicidal or homicidal thoughts you must seek medical attention immediately by calling 911 or calling your MD immediately  if symptoms less severe.  You Must read complete instructions/literature along with all the possible adverse reactions/side effects for all the Medicines you take and that have been prescribed to you. Take any new Medicines after you have completely understood and accpet all the possible adverse reactions/side effects.   Do not drive, operating heavy machinery, perform activities at heights, swimming or participation in water activities or provide baby sitting services if your were admitted for syncope or siezures until you have seen by Primary MD or a Neurologist and advised to do so again.  Do not drive when taking Pain medications.    Do not take more than prescribed Pain, Sleep and Anxiety Medications  Special Instructions: If you have smoked or  chewed Tobacco  in the last 2 yrs please stop smoking, stop any regular Alcohol  and or any Recreational drug use.  Wear Seat belts while driving.   Please note  You were cared for by a hospitalist during your hospital stay. If you have any questions about your discharge medications or the care you received while you were in the hospital after you are discharged, you can call the unit and asked to speak with the hospitalist on call if the hospitalist that took care of you is not available. Once you are discharged, your primary care physician will handle any further medical issues. Please note that NO REFILLS for any discharge medications will be authorized once you are discharged, as it is imperative that you return to your primary care physician (or establish a relationship with a primary care physician if you do not have one) for your aftercare needs so that they can reassess your need for medications and monitor your lab values.

## 2014-08-03 NOTE — Discharge Summary (Signed)
Angel Dunlap, 44 y.o., DOB 06-21-1970, MRN [id number]. Admission date: 08/02/2014 Discharge Date 08/03/2014 Primary MD No primary care provider on file. Admitting Physician Toy Baker, MD   PCP please follow-up on: - Check labs including CBC, BMP during next visit. - to resume her antihypertensive medication in 24 hours after discharge evening initially she was hypotensive please follow and adjust if needed. - Patient with incidental finding on the CAT scan of right inguinal enlarged lymph node, even her history of lymphoma, was instructed to follow with her oncologist Dr. Marin Olp as outpatient, please follow-up.  Admission Diagnosis  Abdominal pain [R10.9] Hypotension [I95.9]  Discharge Diagnosis   Active Problems:   Hypokalemia   Abdominal pain   Diarrhea   Hypotension   Asthma   Prolonged QT interval   Inguinal lymphadenopathy   Past Medical History  Diagnosis Date  . 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 Hypertension   . Depression   . Cancer     Past Surgical History  Procedure Laterality Date  . Abdominal hysterectomy    . Cesarean section    . Bone biopsy      x 2  . Port-a-cath removal    . Portacath placement       Hospital Course See H&P, Labs, Consult and Test reports for all details in brief, patient was admitted for **  Active Problems:   Hypokalemia   Abdominal pain   Diarrhea   Hypotension   Asthma   Prolonged QT interval   Inguinal lymphadenopathy  Admission history of present illness by Dr.DOUTOVA,ANASTASSIA on 4/15;  Angel Dunlap is a 44 y.o. female   has a past medical history of Personal history of malignant lymphoma (09/01/2011); Hypertension; Depression; and Cancer.   Asian reports she was eating out at a restaurant (eating Jethro Bolus and fries) when she developed left upper abdominal pain and some nausea. Her pain was severe and she presented to emergency department. On arrival to emergency  room she was noted to be hypotensive down to 80/49. She was given aggressive IV fluid resuscitation patient had a large episode of diarrhea. After receiving 3 L of IV normal saline her blood pressure has improved and currently back up to 114/74. CT scan of abdomen was obtained showed gallbladder 4 small stones but no CT evidence of cholecystitis or obstruction. Incidentally was a finding of right inguinal femoral lymph node measuring 2.8 cm which was worrisome given prior history of lymphoma.  Of note patient has history of stage IV follicular small cell lymphoma currently in remission followed by Dr. Marin Olp Hospitalist was called for admission for dehydration transient hypotension  Abdominal pain, nausea, diarrhea - This is most likely related to gastroenteritis, UTI abdomen pelvis without acute finding to explain this pain. - Patient diet was advanced, she tolerated a regular diet. - Patient had finding on CT of gallbladder full of gallstones, no right upper quadrant tenderness, evidence of infectious process, instructed to follow with surgery as an outpatient as cholelithiasis might be contributing to her symptoms.  Hypotension - Transient, likely related to diarrhea, resolved, she was instructed to hold antihypertensive medication for the next 48 hours.  Hypokalemia - Repleted, potassium is 4.2 on discharge  Incidental finding of enlarged right inguinal lymph node - Patient was instructed to follow with oncology as an outpatient, special he with her history of lymphoma.    Consults   None  Significant Tests:  See full reports for all details  Dg Chest 2 View  08/02/2014   CLINICAL DATA:  Patient is having pain on the left side of the chest that started today. Patient denies any SOB at this time. Patient states within the past few hours she has developed a cough. Patient has a hx of HTN and asthma.  EXAM: CHEST  2 VIEW  COMPARISON:  None.  FINDINGS: The heart size and mediastinal  contours are within normal limits. Both lungs are clear. No pleural effusion or pneumothorax. The visualized skeletal structures are unremarkable.  IMPRESSION: No active cardiopulmonary disease.   Electronically Signed   By: Lajean Manes M.D.   On: 08/02/2014 19:59   Ct Abdomen Pelvis W Contrast  08/02/2014   CLINICAL DATA:  Left upper quadrant pain. Nausea. History of gallstones. History of lymphoma.  EXAM: CT ABDOMEN AND PELVIS WITH CONTRAST  TECHNIQUE: Multidetector CT imaging of the abdomen and pelvis was performed using the standard protocol following bolus administration of intravenous contrast.  CONTRAST:  115mL OMNIPAQUE IOHEXOL 300 MG/ML  SOLN  COMPARISON:  06/06/2008  FINDINGS: Lung bases are clear. No pleural or pericardial fluid. The liver appears normal without focal lesions or biliary ductal dilatation. Multiple small gallstones fill the gallbladder. No CT evidence of gallbladder inflammation. No visible ductal stone. The spleen is normal in size. No focal lesions. The pancreas is normal. The adrenal glands are normal. The kidneys are normal. No cyst, mass, stone or hydronephrosis. The aorta and IVC are normal. No retroperitoneal mass or adenopathy. No bowel pathology is seen. There has been previous hysterectomy. No acute bone finding. There is a right inguinal/femoral lymph node measuring 2.8 cm in diameter of some concern. However, as an isolated finding, this could be reactive. There are a few small nodes in the right iliac chain, probably not significant.  IMPRESSION: No acute finding by CT.  Gallbladder full of small stones. No CT evidence of cholecystitis or obstruction.  No evidence of acute bowel pathology.  Right inguinal/ femoral lymph node measuring up to 2.8 cm in diameter. This is the only lymphatic system finding of concern in this patient with a previous history of lymphoma.   Electronically Signed   By: Nelson Chimes M.D.   On: 08/02/2014 19:23     Today   Subjective:    Angel Dunlap today has no headache,no chest abdominal pain,no new weakness tingling or numbness, feels much better ,tolelared diet very well, no pain or nausea.  Objective:   Blood pressure 119/85, pulse 76, temperature 98.2 F (36.8 C), temperature source Oral, resp. rate 16, height 5' (1.524 m), weight 99.202 kg (218 lb 11.2 oz), SpO2 100 %.  Intake/Output Summary (Last 24 hours) at 08/03/14 1527 Last data filed at 08/03/14 1300  Gross per 24 hour  Intake   4080 ml  Output    100 ml  Net   3980 ml    Exam Awake Alert, Oriented *3, No new F.N deficits, Normal affect [place].AT,PERRAL Supple Neck,No JVD, No cervical lymphadenopathy appriciated.  Symmetrical Chest wall movement, Good air movement bilaterally, CTAB RRR,No Gallops,Rubs or new Murmurs, No Parasternal Heave +ve B.Sounds, Abd Soft, nontender, No organomegaly appriciated, No rebound -guarding or rigidity. No Cyanosis, Clubbing or edema, No new Rash or bruise  Data Review   Cultures -   CBC w Diff: Lab Results  Component Value Date   WBC 7.3 08/03/2014   WBC 8.2 09/01/2011   HGB 12.4 08/03/2014   HGB 14.4 09/01/2011   HCT 37.0 08/03/2014  HCT 41.3 09/01/2011   PLT 224 08/03/2014   PLT 265 09/01/2011   LYMPHOPCT 35 08/02/2014   LYMPHOPCT 33.0 09/01/2011   BANDSPCT 0 06/06/2008   MONOPCT 3 08/02/2014   MONOPCT 5.7 09/01/2011   EOSPCT 1 08/02/2014   EOSPCT 1.2 09/01/2011   BASOPCT 0 08/02/2014   BASOPCT 0.5 09/01/2011   CMP: Lab Results  Component Value Date   NA 140 08/03/2014   K 4.2 08/03/2014   CL 111 08/03/2014   CO2 25 08/03/2014   BUN 10 08/03/2014   CREATININE 0.72 08/03/2014   PROT 5.1* 08/03/2014   ALBUMIN 3.2* 08/03/2014   BILITOT 0.3 08/03/2014   ALKPHOS 57 08/03/2014   AST 15 08/03/2014   ALT 10 08/03/2014  .  Micro Results No results found for this or any previous visit (from the past 240 hour(s)).   Discharge Instructions      Follow-up Information    Follow up  with Elizabeth Palau, MD. Schedule an appointment as soon as possible for a visit in 1 week.   Specialty:  Family Medicine   Why:  post hospitalization follow up.   Contact information:   [address] [place] [address] [phone]       Follow up with Volanda Napoleon, MD. Schedule an appointment as soon as possible for a visit in 1 week.   Specialty:  Oncology   Why:  enlarged right inguinal lymph node.   Contact information:   [address], [address] [place] [address] [phone]       Follow up with [place]. Call in 4 weeks.   Why:  Regarding gallbladder stones   Contact information:   [place] [phone]      Discharge Medications     Medication List    TAKE these medications        albuterol 108 (90 BASE) MCG/ACT inhaler  Commonly known as:  PROVENTIL HFA;VENTOLIN HFA  Inhale 2 puffs into the lungs every 6 (six) hours as needed for wheezing.     amLODipine 10 MG tablet  Commonly known as:  NORVASC  Take 1 tablet (10 mg total) by mouth daily.  Start taking on:  08/05/2014     hydrochlorothiazide 25 MG tablet  Commonly known as:  HYDRODIURIL  Take 1 tablet (25 mg total) by mouth daily.  Start taking on:  08/05/2014     HYDROcodone-acetaminophen 5-325 MG per tablet  Commonly known as:  NORCO/VICODIN  Take 1 tablet by mouth every 4 (four) hours as needed for pain.     lisinopril 40 MG tablet  Commonly known as:  PRINIVIL,ZESTRIL  Take 1 tablet (40 mg total) by mouth daily.  Start taking on:  08/05/2014     omeprazole 20 MG capsule  Commonly known as:  PRILOSEC  Take 1 capsule (20 mg total) by mouth daily.     phentermine 37.5 MG capsule  Take 37.5 mg by mouth every morning.         Total Time in preparing paper work, data evaluation and todays exam - 35 minutes  Eknoor Novack M.D on 08/03/2014 at 3:27 PM  [place]  [facility] Office  [phone]

## 2014-08-05 ENCOUNTER — Other Ambulatory Visit: Payer: Self-pay | Admitting: Hematology & Oncology

## 2014-08-05 ENCOUNTER — Telehealth: Payer: Self-pay | Admitting: Hematology & Oncology

## 2014-08-05 DIAGNOSIS — Z8572 Personal history of non-Hodgkin lymphomas: Secondary | ICD-10-CM

## 2014-08-05 NOTE — Care Management Note (Signed)
    Page 1 of 1   08/05/2014     10:43:52 AM CARE MANAGEMENT NOTE 08/05/2014  Patient:  Angel Dunlap,Angel Dunlap   Account Number:  [id number]  Date Initiated:  08/05/2014  Documentation initiated by:  Dessa Phi  Subjective/Objective Assessment:     Action/Plan:   From home.   Anticipated DC Date:  08/03/2014   Anticipated DC Plan:  [place]  CM consult      Choice offered to / List presented to:             Status of service:  Completed, signed off Medicare Important Message given?   (If response is "NO", the following Medicare IM given date fields will be blank) Date Medicare IM given:   Medicare IM given by:   Date Additional Medicare IM given:   Additional Medicare IM given by:    Discharge Disposition:  HOME/SELF CARE  Per UR Regulation:  Reviewed for med. necessity/level of care/duration of stay  If discussed at [place] of Stay Meetings, dates discussed:    Comments:  08/05/14 Dessa Phi RN BSN NCM [phone] late entry note from 08/03/14-Provided w/community resources-health insurance info,$4 [facility] med list,pcp listing-encouraging [facility]-patient will set up on appt.No further d/c needs.

## 2014-08-05 NOTE — Telephone Encounter (Signed)
Pt aware of 5-9 appointment

## 2014-08-06 LAB — T3: T3, Total: 80 ng/dL (ref 71–180)

## 2014-08-26 ENCOUNTER — Other Ambulatory Visit (HOSPITAL_BASED_OUTPATIENT_CLINIC_OR_DEPARTMENT_OTHER): Payer: Self-pay

## 2014-08-26 ENCOUNTER — Encounter: Payer: Self-pay | Admitting: Hematology & Oncology

## 2014-08-26 ENCOUNTER — Ambulatory Visit (HOSPITAL_BASED_OUTPATIENT_CLINIC_OR_DEPARTMENT_OTHER): Payer: Self-pay | Admitting: Hematology & Oncology

## 2014-08-26 VITALS — BP 133/92 | HR 82 | Temp 97.8°F | Resp 16 | Ht 60.0 in | Wt 216.0 lb

## 2014-08-26 DIAGNOSIS — Z8572 Personal history of non-Hodgkin lymphomas: Secondary | ICD-10-CM

## 2014-08-26 DIAGNOSIS — C8599 Non-Hodgkin lymphoma, unspecified, extranodal and solid organ sites: Secondary | ICD-10-CM

## 2014-08-26 DIAGNOSIS — Z8579 Personal history of other malignant neoplasms of lymphoid, hematopoietic and related tissues: Secondary | ICD-10-CM

## 2014-08-26 LAB — CMP (CANCER CENTER ONLY)
ALT(SGPT): 17 U/L (ref 10–47)
AST: 17 U/L (ref 11–38)
Albumin: 3.9 g/dL (ref 3.3–5.5)
Alkaline Phosphatase: 60 U/L (ref 26–84)
BILIRUBIN TOTAL: 0.8 mg/dL (ref 0.20–1.60)
BUN, Bld: 11 mg/dL (ref 7–22)
CHLORIDE: 106 meq/L (ref 98–108)
CO2: 30 meq/L (ref 18–33)
CREATININE: 1 mg/dL (ref 0.6–1.2)
Calcium: 9.1 mg/dL (ref 8.0–10.3)
Glucose, Bld: 95 mg/dL (ref 73–118)
POTASSIUM: 3.7 meq/L (ref 3.3–4.7)
SODIUM: 142 meq/L (ref 128–145)
TOTAL PROTEIN: 6.5 g/dL (ref 6.4–8.1)

## 2014-08-26 LAB — CBC WITH DIFFERENTIAL (CANCER CENTER ONLY)
BASO#: 0 10*3/uL (ref 0.0–0.2)
BASO%: 0.4 % (ref 0.0–2.0)
EOS%: 2.5 % (ref 0.0–7.0)
Eosinophils Absolute: 0.2 10*3/uL (ref 0.0–0.5)
HCT: 40.3 % (ref 34.8–46.6)
HGB: 13.9 g/dL (ref 11.6–15.9)
LYMPH#: 2.6 10*3/uL (ref 0.9–3.3)
LYMPH%: 38.3 % (ref 14.0–48.0)
MCH: 33.7 pg (ref 26.0–34.0)
MCHC: 34.5 g/dL (ref 32.0–36.0)
MCV: 98 fL (ref 81–101)
MONO#: 0.5 10*3/uL (ref 0.1–0.9)
MONO%: 6.6 % (ref 0.0–13.0)
NEUT%: 52.2 % (ref 39.6–80.0)
NEUTROS ABS: 3.6 10*3/uL (ref 1.5–6.5)
Platelets: 290 10*3/uL (ref 145–400)
RBC: 4.12 10*6/uL (ref 3.70–5.32)
RDW: 12.9 % (ref 11.1–15.7)
WBC: 6.9 10*3/uL (ref 3.9–10.0)

## 2014-08-26 NOTE — Progress Notes (Signed)
Hematology and Oncology Follow Up Visit  Angel Dunlap [id number] 06-22-1970 44 y.o. 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 His been 3 years since we last saw her. She recently had a CT scan. This showed a solitary lymph node in the right inguinal femoral region. This measured 2.8 cm. She had this scan done because of some abdominal discomfort which was transient.  She's not noted any issues with nausea or vomiting. She had had an episode of sweating during this episode.  There's been no change in bowel or bladder habits. She had a little bit of diarrhea.  It sounded as if she may have had some chronic gastro-enteritis.  She's had no problem with weight loss. She's had no rashes. She's had no pruritus. She's had no bleeding.  Medications:  Current outpatient prescriptions:  .  albuterol (PROVENTIL HFA;VENTOLIN HFA) 108 (90 BASE) MCG/ACT inhaler, Inhale 2 puffs into the lungs every 6 (six) hours as needed for wheezing., Disp: , Rfl:  .  amLODipine (NORVASC) 10 MG tablet, Take 1 tablet (10 mg total) by mouth daily., Disp: , Rfl:  .  hydrochlorothiazide (HYDRODIURIL) 25 MG tablet, Take 1 tablet (25 mg total) by mouth daily., Disp: , Rfl:  .  lisinopril (PRINIVIL,ZESTRIL) 40 MG tablet, Take 1 tablet (40 mg total) by mouth daily., Disp: , Rfl:  .  phentermine 37.5 MG capsule, Take 37.5 mg by mouth every morning., Disp: , Rfl:   Allergies:  Allergies  Allergen Reactions  . Penicillins Swelling and Rash    Past Medical History, Surgical history, Social history, and Family History were reviewed and updated.  Review of Systems: As above  Physical Exam:  height is 5' (1.524 m) and weight is 216 lb (97.977 kg). Her oral temperature is 97.8 F (36.6 C). Her blood pressure is 133/92 and her pulse is 82.  Her respiration is 16.   Wt Readings from Last 3 Encounters:  08/26/14 216 lb (97.977 kg)  08/02/14 218 lb 11.2 oz (99.202 kg)  09/01/11 186 lb (84.369 kg)     Obese Afro-American female. Head and neck exam shows no ocular or oral lesions. There is no adenopathy in the neck or super clavicular region. Lungs are clear. Cardiac exam regular rate and rhythm. Axillary exam shows no bilateral axillary adenopathy. Abdomen is soft. Abdomen is obese. She has good L sounds. There is no fluid wave. There is no obvious inguinal adenopathy. Extremities shows no clubbing, cyanosis or edema. Neurological exam shows no local neurological deficits. Skin exam shows no rashes, ecchymoses or petechia.  Lab Results  Component Value Date   WBC 6.9 08/26/2014   HGB 13.9 08/26/2014   HCT 40.3 08/26/2014   MCV 98 08/26/2014   PLT 290 08/26/2014     Chemistry      Component Value Date/Time   NA 140 08/03/2014 0549   K 4.2 08/03/2014 0549   CL 111 08/03/2014 0549   CO2 25 08/03/2014 0549   BUN 10 08/03/2014 0549   CREATININE 0.72 08/03/2014 0549      Component Value Date/Time   CALCIUM 7.7* 08/03/2014 0549   ALKPHOS 57 08/03/2014 0549   AST 15 08/03/2014 0549   ALT 10 08/03/2014 0549   BILITOT 0.3 08/03/2014 0549         Impression and Plan: [name] is 44 year old African-American female. 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 She had a fairly high FLIPI score of 4. I suppose that she could be a at risk for recurrence. I'm not sure with a solitary noted that this would need recurrence.  I think the easiest thing to do is to get another scan on her in about 2 months. I think this would be very reasonable. If we find growth, that he we will have to consider a biopsy.  She is totally asymptomatic with this.  We will plan to see her back when she has her scan done.   Volanda Napoleon, MD 5/9/20168:38 AM

## 2014-08-27 ENCOUNTER — Telehealth: Payer: Self-pay | Admitting: *Deleted

## 2014-08-27 NOTE — Telephone Encounter (Addendum)
Patient aware of results.   ----- Message from Volanda Napoleon, MD sent at 08/26/2014  6:33 PM EDT ----- Please call and tell her that the labs look okay. Thanks

## 2014-08-28 LAB — BETA 2 MICROGLOBULIN, SERUM: BETA 2 MICROGLOBULIN: 1.78 mg/L (ref <=2.51)

## 2014-08-28 LAB — LACTATE DEHYDROGENASE: LDH: 151 U/L (ref 94–250)

## 2014-11-05 ENCOUNTER — Other Ambulatory Visit: Payer: Self-pay | Admitting: *Deleted

## 2014-11-05 DIAGNOSIS — Z8572 Personal history of non-Hodgkin lymphomas: Secondary | ICD-10-CM

## 2014-11-06 ENCOUNTER — Ambulatory Visit: Payer: Medicaid Other | Admitting: Hematology & Oncology

## 2014-11-06 ENCOUNTER — Telehealth: Payer: Self-pay | Admitting: Hematology & Oncology

## 2014-11-06 ENCOUNTER — Ambulatory Visit (HOSPITAL_BASED_OUTPATIENT_CLINIC_OR_DEPARTMENT_OTHER): Payer: Self-pay

## 2014-11-06 ENCOUNTER — Other Ambulatory Visit: Payer: Medicaid Other

## 2014-11-06 NOTE — Telephone Encounter (Signed)
Phone not accepting incoming calls

## 2018-02-14 ENCOUNTER — Emergency Department (HOSPITAL_COMMUNITY): Payer: No Typology Code available for payment source

## 2018-02-14 ENCOUNTER — Encounter (HOSPITAL_COMMUNITY): Payer: Self-pay

## 2018-02-14 ENCOUNTER — Emergency Department (HOSPITAL_COMMUNITY)
Admission: EM | Admit: 2018-02-14 | Discharge: 2018-02-14 | Disposition: A | Discharge status: Home / Self Care | Payer: No Typology Code available for payment source | Attending: Emergency Medicine | Admitting: Emergency Medicine

## 2018-02-14 DIAGNOSIS — Y999 Unspecified external cause status: Secondary | ICD-10-CM | POA: Diagnosis not present

## 2018-02-14 DIAGNOSIS — F17210 Nicotine dependence, cigarettes, uncomplicated: Secondary | ICD-10-CM | POA: Diagnosis not present

## 2018-02-14 DIAGNOSIS — Y92410 Unspecified street and highway as the place of occurrence of the external cause: Secondary | ICD-10-CM | POA: Diagnosis not present

## 2018-02-14 DIAGNOSIS — Z452 Encounter for adjustment and management of vascular access device: Secondary | ICD-10-CM | POA: Diagnosis present

## 2018-02-14 DIAGNOSIS — I10 Essential (primary) hypertension: Secondary | ICD-10-CM | POA: Insufficient documentation

## 2018-02-14 DIAGNOSIS — Y9389 Activity, other specified: Secondary | ICD-10-CM | POA: Insufficient documentation

## 2018-02-14 DIAGNOSIS — J45909 Unspecified asthma, uncomplicated: Secondary | ICD-10-CM | POA: Insufficient documentation

## 2018-02-14 DIAGNOSIS — Z79899 Other long term (current) drug therapy: Secondary | ICD-10-CM | POA: Diagnosis not present

## 2018-02-14 DIAGNOSIS — Z95828 Presence of other vascular implants and grafts: Secondary | ICD-10-CM

## 2018-02-14 MED ORDER — CYCLOBENZAPRINE HCL 5 MG PO TABS: 5.0000 mg | ORAL_TABLET | Freq: Three times a day (TID) | ORAL | 0 refills | Status: AC | PRN

## 2018-02-14 MED ORDER — HYDROCODONE-ACETAMINOPHEN 5-325 MG PO TABS
1.0000 | ORAL_TABLET | Freq: Once | ORAL | Status: AC
  Administered 2018-02-14: 1 via ORAL
  Filled 2018-02-14: qty 1
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
## 2018-02-14 NOTE — Discharge Instructions (Addendum)
Your port is in the right place. The port may be used if needed.   Take your norco as prescribed by your doctor. Expect to be stiff and sore for several days.   Add flexeril for muscle spasms.   See your doctor  Return to ER if you have worse pain, chest pain, trouble breathing, abdominal pain.
